# Patient Record
Sex: Female | Born: 1965 | Race: White | Hispanic: No | Marital: Married | Smoking: Former smoker
Health system: Southern US, Community
[De-identification: ages and names within clinical notes are randomized; demographics above are authoritative.]

## PROBLEM LIST (undated history)

## (undated) DIAGNOSIS — K219 Gastro-esophageal reflux disease without esophagitis: Secondary | ICD-10-CM

## (undated) DIAGNOSIS — I10 Essential (primary) hypertension: Secondary | ICD-10-CM

## (undated) DIAGNOSIS — C801 Malignant (primary) neoplasm, unspecified: Secondary | ICD-10-CM

## (undated) HISTORY — PX: CHOLECYSTECTOMY: SHX55

## (undated) HISTORY — DX: Malignant (primary) neoplasm, unspecified: C80.1

## (undated) HISTORY — DX: Gastro-esophageal reflux disease without esophagitis: K21.9

## (undated) HISTORY — DX: Essential (primary) hypertension: I10

---

## 2004-05-03 ENCOUNTER — Emergency Department: Payer: Self-pay | Admitting: General Practice

## 2004-07-25 ENCOUNTER — Emergency Department: Payer: Self-pay | Admitting: Emergency Medicine

## 2004-08-14 ENCOUNTER — Emergency Department: Payer: Self-pay | Admitting: Emergency Medicine

## 2004-08-17 ENCOUNTER — Emergency Department: Payer: Self-pay | Admitting: Unknown Physician Specialty

## 2005-02-11 ENCOUNTER — Emergency Department: Payer: Self-pay | Admitting: Emergency Medicine

## 2005-02-20 ENCOUNTER — Ambulatory Visit: Payer: Self-pay

## 2006-01-21 ENCOUNTER — Emergency Department: Payer: Self-pay | Admitting: Unknown Physician Specialty

## 2007-01-07 ENCOUNTER — Emergency Department: Payer: Self-pay

## 2007-06-18 ENCOUNTER — Emergency Department: Payer: Self-pay | Admitting: Emergency Medicine

## 2007-07-11 ENCOUNTER — Emergency Department: Payer: Self-pay | Admitting: Emergency Medicine

## 2008-01-30 ENCOUNTER — Emergency Department: Payer: Self-pay | Admitting: Emergency Medicine

## 2008-04-29 ENCOUNTER — Emergency Department: Payer: Self-pay | Admitting: Unknown Physician Specialty

## 2008-06-12 ENCOUNTER — Emergency Department: Payer: Self-pay | Admitting: Emergency Medicine

## 2008-10-08 ENCOUNTER — Emergency Department: Payer: Self-pay | Admitting: Emergency Medicine

## 2009-02-28 ENCOUNTER — Emergency Department: Payer: Self-pay | Admitting: Emergency Medicine

## 2009-12-05 ENCOUNTER — Emergency Department: Payer: Self-pay | Admitting: Emergency Medicine

## 2012-01-01 ENCOUNTER — Ambulatory Visit: Payer: Self-pay | Admitting: Family Medicine

## 2012-03-03 ENCOUNTER — Emergency Department: Payer: Self-pay | Admitting: Emergency Medicine

## 2013-11-23 ENCOUNTER — Emergency Department: Payer: Self-pay | Admitting: Emergency Medicine

## 2014-01-07 ENCOUNTER — Emergency Department: Payer: Self-pay | Admitting: Emergency Medicine

## 2014-03-27 ENCOUNTER — Emergency Department: Payer: Self-pay | Admitting: Student

## 2014-04-22 ENCOUNTER — Emergency Department: Payer: Self-pay | Admitting: Emergency Medicine

## 2014-04-22 LAB — COMPREHENSIVE METABOLIC PANEL
ALK PHOS: 276 U/L — AB
AST: 29 U/L (ref 15–37)
Albumin: 3.3 g/dL — ABNORMAL LOW (ref 3.4–5.0)
Anion Gap: 9 (ref 7–16)
BUN: 16 mg/dL (ref 7–18)
Bilirubin,Total: 0.5 mg/dL (ref 0.2–1.0)
CHLORIDE: 102 mmol/L (ref 98–107)
CO2: 22 mmol/L (ref 21–32)
CREATININE: 1.01 mg/dL (ref 0.60–1.30)
Calcium, Total: 9.8 mg/dL (ref 8.5–10.1)
EGFR (African American): >60
EGFR (Non-African Amer.): >60
Glucose: 104 mg/dL — ABNORMAL HIGH (ref 65–99)
Osmolality: 268 (ref 275–301)
POTASSIUM: 3.6 mmol/L (ref 3.5–5.1)
SGPT (ALT): 17 U/L
Sodium: 133 mmol/L — ABNORMAL LOW (ref 136–145)
TOTAL PROTEIN: 8.1 g/dL (ref 6.4–8.2)

## 2014-04-22 LAB — CBC WITH DIFFERENTIAL/PLATELET
Basophil #: 0.2 10*3/uL — ABNORMAL HIGH (ref 0.0–0.1)
Basophil %: 1 %
EOS ABS: 0.2 10*3/uL (ref 0.0–0.7)
Eosinophil %: 1.2 %
HCT: 36.1 % (ref 35.0–47.0)
HGB: 11.4 g/dL — ABNORMAL LOW (ref 12.0–16.0)
Lymphocyte #: 3.4 10*3/uL (ref 1.0–3.6)
Lymphocyte %: 20.4 %
MCH: 27.4 pg (ref 26.0–34.0)
MCHC: 31.5 g/dL — ABNORMAL LOW (ref 32.0–36.0)
MCV: 87 fL (ref 80–100)
Monocyte #: 1.4 x10 3/mm — ABNORMAL HIGH (ref 0.2–0.9)
Monocyte %: 8.2 %
Neutrophil #: 11.7 10*3/uL — ABNORMAL HIGH (ref 1.4–6.5)
Neutrophil %: 69.2 %
Platelet: 352 10*3/uL (ref 150–440)
RBC: 4.15 10*6/uL (ref 3.80–5.20)
RDW: 17 % — AB (ref 11.5–14.5)
WBC: 16.9 10*3/uL — ABNORMAL HIGH (ref 3.6–11.0)

## 2014-04-22 LAB — URINALYSIS, COMPLETE
BACTERIA: NONE SEEN
BLOOD: NEGATIVE
Bilirubin,UR: NEGATIVE
GLUCOSE, UR: NEGATIVE mg/dL (ref 0–75)
Ketone: NEGATIVE
Leukocyte Esterase: NEGATIVE
NITRITE: NEGATIVE
Ph: 5 (ref 4.5–8.0)
Protein: NEGATIVE
RBC,UR: <1 /HPF (ref 0–5)
Specific Gravity: 1.012 (ref 1.003–1.030)
WBC UR: 1 /HPF (ref 0–5)

## 2014-04-22 LAB — ACETAMINOPHEN LEVEL: Acetaminophen: <2 ug/mL

## 2014-04-22 LAB — LIPASE, BLOOD: Lipase: 68 U/L — ABNORMAL LOW (ref 73–393)

## 2014-05-01 ENCOUNTER — Ambulatory Visit: Payer: Self-pay | Admitting: Oncology

## 2014-05-03 ENCOUNTER — Ambulatory Visit: Payer: Self-pay | Admitting: Oncology

## 2014-05-03 DIAGNOSIS — C801 Malignant (primary) neoplasm, unspecified: Secondary | ICD-10-CM

## 2014-05-03 HISTORY — DX: Malignant (primary) neoplasm, unspecified: C80.1

## 2014-05-03 LAB — COMPREHENSIVE METABOLIC PANEL
ALK PHOS: 223 U/L — AB
ALT: 19 U/L
AST: 18 U/L (ref 15–37)
Albumin: 3.2 g/dL — ABNORMAL LOW (ref 3.4–5.0)
Anion Gap: 11 (ref 7–16)
BILIRUBIN TOTAL: 0.4 mg/dL (ref 0.2–1.0)
BUN: 15 mg/dL (ref 7–18)
CREATININE: 0.79 mg/dL (ref 0.60–1.30)
Calcium, Total: 10.1 mg/dL (ref 8.5–10.1)
Chloride: 99 mmol/L (ref 98–107)
Co2: 28 mmol/L (ref 21–32)
EGFR (Non-African Amer.): >60
Glucose: 102 mg/dL — ABNORMAL HIGH (ref 65–99)
Osmolality: 277 (ref 275–301)
POTASSIUM: 3.5 mmol/L (ref 3.5–5.1)
Sodium: 138 mmol/L (ref 136–145)
Total Protein: 7.7 g/dL (ref 6.4–8.2)

## 2014-05-03 LAB — CBC CANCER CENTER
Basophil #: 0.1 x10 3/mm (ref 0.0–0.1)
Basophil %: 0.6 %
EOS ABS: 0.2 x10 3/mm (ref 0.0–0.7)
Eosinophil %: 1.8 %
HCT: 37.4 % (ref 35.0–47.0)
HGB: 11.9 g/dL — AB (ref 12.0–16.0)
Lymphocyte #: 2.2 x10 3/mm (ref 1.0–3.6)
Lymphocyte %: 17.8 %
MCH: 27.3 pg (ref 26.0–34.0)
MCHC: 31.8 g/dL — ABNORMAL LOW (ref 32.0–36.0)
MCV: 86 fL (ref 80–100)
Monocyte #: 0.9 x10 3/mm (ref 0.2–0.9)
Monocyte %: 7.2 %
Neutrophil #: 9 x10 3/mm — ABNORMAL HIGH (ref 1.4–6.5)
Neutrophil %: 72.6 %
Platelet: 451 x10 3/mm — ABNORMAL HIGH (ref 150–440)
RBC: 4.37 10*6/uL (ref 3.80–5.20)
RDW: 16.6 % — ABNORMAL HIGH (ref 11.5–14.5)
WBC: 12.4 x10 3/mm — ABNORMAL HIGH (ref 3.6–11.0)

## 2014-05-03 LAB — PROTIME-INR
INR: 1.1
Prothrombin Time: 14.4 secs (ref 11.5–14.7)

## 2014-05-03 LAB — APTT: Activated PTT: 29.3 secs (ref 23.6–35.9)

## 2014-05-04 LAB — CEA: CEA: 82.6 ng/mL — AB (ref 0.0–4.7)

## 2014-05-04 LAB — PROT IMMUNOELECTROPHORES(ARMC)

## 2014-05-04 LAB — CA 125: CA 125: 51.3 U/mL — AB (ref 0.0–34.0)

## 2014-05-04 LAB — CANCER ANTIGEN 19-9: CA 19-9: 10 U/mL (ref 0–35)

## 2014-05-04 LAB — CANCER ANTIGEN 27.29: CA 27.29: 73.5 U/mL — ABNORMAL HIGH (ref 0.0–38.6)

## 2014-05-06 LAB — URINE IEP, RANDOM

## 2014-05-12 ENCOUNTER — Ambulatory Visit: Payer: Self-pay | Admitting: Internal Medicine

## 2014-05-17 ENCOUNTER — Ambulatory Visit: Payer: Self-pay | Admitting: Internal Medicine

## 2014-05-24 ENCOUNTER — Inpatient Hospital Stay: Payer: Self-pay | Admitting: Internal Medicine

## 2014-05-24 LAB — CBC
HCT: 38.3 % (ref 35.0–47.0)
HGB: 12.4 g/dL (ref 12.0–16.0)
MCH: 27 pg (ref 26.0–34.0)
MCHC: 32.4 g/dL (ref 32.0–36.0)
MCV: 83 fL (ref 80–100)
Platelet: 347 10*3/uL (ref 150–440)
RBC: 4.61 10*6/uL (ref 3.80–5.20)
RDW: 16.8 % — ABNORMAL HIGH (ref 11.5–14.5)
WBC: 14.7 10*3/uL — ABNORMAL HIGH (ref 3.6–11.0)

## 2014-05-24 LAB — COMPREHENSIVE METABOLIC PANEL
AST: 20 U/L (ref 15–37)
Albumin: 2.4 g/dL — ABNORMAL LOW (ref 3.4–5.0)
Alkaline Phosphatase: 313 U/L — ABNORMAL HIGH
Anion Gap: 9 (ref 7–16)
BILIRUBIN TOTAL: 0.5 mg/dL (ref 0.2–1.0)
BUN: 10 mg/dL (ref 7–18)
CHLORIDE: 102 mmol/L (ref 98–107)
CREATININE: 0.96 mg/dL (ref 0.60–1.30)
Calcium, Total: 9.1 mg/dL (ref 8.5–10.1)
Co2: 27 mmol/L (ref 21–32)
EGFR (African American): >60
EGFR (Non-African Amer.): >60
Glucose: 90 mg/dL (ref 65–99)
OSMOLALITY: 274 (ref 275–301)
Potassium: 3.2 mmol/L — ABNORMAL LOW (ref 3.5–5.1)
SGPT (ALT): 12 U/L — ABNORMAL LOW
SODIUM: 138 mmol/L (ref 136–145)
Total Protein: 7.1 g/dL (ref 6.4–8.2)

## 2014-05-25 LAB — BASIC METABOLIC PANEL
Anion Gap: 8 (ref 7–16)
BUN: 10 mg/dL (ref 7–18)
CALCIUM: 8.8 mg/dL (ref 8.5–10.1)
CREATININE: 0.71 mg/dL (ref 0.60–1.30)
Chloride: 107 mmol/L (ref 98–107)
Co2: 26 mmol/L (ref 21–32)
EGFR (African American): >60
EGFR (Non-African Amer.): >60
Glucose: 84 mg/dL (ref 65–99)
OSMOLALITY: 279 (ref 275–301)
POTASSIUM: 3.8 mmol/L (ref 3.5–5.1)
Sodium: 141 mmol/L (ref 136–145)

## 2014-05-25 LAB — CBC WITH DIFFERENTIAL/PLATELET
BASOS PCT: 0.6 %
Basophil #: 0.1 10*3/uL (ref 0.0–0.1)
EOS ABS: 0.2 10*3/uL (ref 0.0–0.7)
EOS PCT: 2.2 %
HCT: 36.4 % (ref 35.0–47.0)
HGB: 11.6 g/dL — AB (ref 12.0–16.0)
LYMPHS PCT: 12 %
Lymphocyte #: 1.4 10*3/uL (ref 1.0–3.6)
MCH: 27.1 pg (ref 26.0–34.0)
MCHC: 31.8 g/dL — AB (ref 32.0–36.0)
MCV: 85 fL (ref 80–100)
Monocyte #: 0.9 x10 3/mm (ref 0.2–0.9)
Monocyte %: 7.6 %
NEUTROS PCT: 77.6 %
Neutrophil #: 8.8 10*3/uL — ABNORMAL HIGH (ref 1.4–6.5)
Platelet: 314 10*3/uL (ref 150–440)
RBC: 4.27 10*6/uL (ref 3.80–5.20)
RDW: 16.5 % — ABNORMAL HIGH (ref 11.5–14.5)
WBC: 11.3 10*3/uL — AB (ref 3.6–11.0)

## 2014-05-25 LAB — TSH: Thyroid Stimulating Horm: 0.07 u[IU]/mL — ABNORMAL LOW

## 2014-05-25 LAB — T4, FREE: Free Thyroxine: 0.93 ng/dL (ref 0.76–1.46)

## 2014-05-31 ENCOUNTER — Ambulatory Visit: Payer: Self-pay | Admitting: Oncology

## 2014-06-02 LAB — CBC CANCER CENTER
Basophil #: 0.1 x10 3/mm (ref 0.0–0.1)
Basophil %: 0.5 %
Eosinophil #: 0 x10 3/mm (ref 0.0–0.7)
Eosinophil %: 0 %
HCT: 40.8 % (ref 35.0–47.0)
HGB: 12.8 g/dL (ref 12.0–16.0)
LYMPHS ABS: 1.3 x10 3/mm (ref 1.0–3.6)
Lymphocyte %: 5.9 %
MCH: 25.9 pg — AB (ref 26.0–34.0)
MCHC: 31.3 g/dL — ABNORMAL LOW (ref 32.0–36.0)
MCV: 83 fL (ref 80–100)
Monocyte #: 1 x10 3/mm — ABNORMAL HIGH (ref 0.2–0.9)
Monocyte %: 4.2 %
NEUTROS ABS: 20.3 x10 3/mm — AB (ref 1.4–6.5)
NEUTROS PCT: 89.4 %
Platelet: 462 x10 3/mm — ABNORMAL HIGH (ref 150–440)
RBC: 4.93 10*6/uL (ref 3.80–5.20)
RDW: 17.5 % — ABNORMAL HIGH (ref 11.5–14.5)
WBC: 22.7 x10 3/mm — ABNORMAL HIGH (ref 3.6–11.0)

## 2014-06-02 LAB — COMPREHENSIVE METABOLIC PANEL
Albumin: 2.5 g/dL — ABNORMAL LOW (ref 3.4–5.0)
Alkaline Phosphatase: 439 U/L — ABNORMAL HIGH
Anion Gap: 13 (ref 7–16)
BUN: 22 mg/dL — AB (ref 7–18)
Bilirubin,Total: 0.4 mg/dL (ref 0.2–1.0)
CHLORIDE: 100 mmol/L (ref 98–107)
Calcium, Total: 8.7 mg/dL (ref 8.5–10.1)
Co2: 23 mmol/L (ref 21–32)
Creatinine: 1.08 mg/dL (ref 0.60–1.30)
EGFR (Non-African Amer.): 57 — ABNORMAL LOW
Glucose: 164 mg/dL — ABNORMAL HIGH (ref 65–99)
Osmolality: 279 (ref 275–301)
POTASSIUM: 4.1 mmol/L (ref 3.5–5.1)
SGOT(AST): 71 U/L — ABNORMAL HIGH (ref 15–37)
SGPT (ALT): 158 U/L — ABNORMAL HIGH
SODIUM: 136 mmol/L (ref 136–145)
Total Protein: 7.4 g/dL (ref 6.4–8.2)

## 2014-06-07 ENCOUNTER — Encounter: Payer: Self-pay | Admitting: *Deleted

## 2014-06-08 ENCOUNTER — Ambulatory Visit (INDEPENDENT_AMBULATORY_CARE_PROVIDER_SITE_OTHER): Payer: Self-pay | Admitting: General Surgery

## 2014-06-08 ENCOUNTER — Encounter: Payer: Self-pay | Admitting: General Surgery

## 2014-06-08 VITALS — BP 120/78 | HR 78 | Resp 12 | Ht 61.0 in | Wt 131.0 lb

## 2014-06-08 DIAGNOSIS — C3432 Malignant neoplasm of lower lobe, left bronchus or lung: Secondary | ICD-10-CM

## 2014-06-08 NOTE — Progress Notes (Signed)
Patient ID: [name], female   DOB: [birth year], 48 y.o.   MRN: [id number]  Chief Complaint  Patient presents with  . Other    port placement    HPI RAILEIGH Moyer is a 48 y.o. female.  Here today for evaluation of port placement. She was diagnosed with lung cancer with bone and brain mets. It was diagnosed 05-03-14. She has already had a few radiation as well as her first chemotherapy treatments. She has radiation scheduled when she leaves here today.  The patient is in a wheelchair today. Lower extremity strength has been markedly affected by her spinal metastases. Her family reports she makes use of a walker at home. She is here with her daughter and mother.   HPI  Past Medical History  Diagnosis Date  . Hypertension   . GERD (gastroesophageal reflux disease)   . Cancer 05-03-14    lung with mets to lower spine and brain    Past Surgical History  Procedure Laterality Date  . Cholecystectomy      Family History  Problem Relation Age of Onset  . Hypertension Mother   . Hypertension Father   . Diabetes Father   . Thyroid disease Mother     Social History History  Substance Use Topics  . Smoking status: Former Smoker -- 30 years    Quit date: 05/23/2014  . Smokeless tobacco: Not on file  . Alcohol Use: No    No Known Allergies  Current Outpatient Prescriptions  Medication Sig Dispense Refill  . albuterol (PROVENTIL HFA;VENTOLIN HFA) 108 (90 BASE) MCG/ACT inhaler Inhale 2 puffs into the lungs every 6 (six) hours as needed for wheezing or shortness of breath.    Marland Kitchen atenolol (TENORMIN) 100 MG tablet Take 100 mg by mouth daily.    Marland Kitchen dexamethasone (DECADRON) 4 MG tablet Take 4 mg by mouth 2 (two) times daily.   1  . fentaNYL (DURAGESIC - DOSED MCG/HR) 100 MCG/HR Place 100 mcg onto the skin every 3 (three) days.    Marland Kitchen gabapentin (NEURONTIN) 100 MG capsule Take 100 mg by mouth 3 (three) times daily.    Marland Kitchen guaiFENesin (MUCINEX) 600 MG 12 hr tablet Take by mouth 2 (two)  times daily as needed.    Marland Kitchen HYDROcodone-acetaminophen (NORCO/VICODIN) 5-325 MG per tablet Take 1 tablet by mouth every 6 (six) hours as needed for moderate pain.    Marland Kitchen morphine (MSIR) 15 MG tablet Take 15 mg by mouth every 4 (four) hours as needed.   0  . pantoprazole (PROTONIX) 40 MG tablet Take 40 mg by mouth daily.    . prochlorperazine (COMPAZINE) 10 MG tablet Take 10 mg by mouth every 6 (six) hours as needed for nausea or vomiting.    . tiotropium (SPIRIVA HANDIHALER) 18 MCG inhalation capsule Place 18 mcg into inhaler and inhale daily.    Marland Kitchen venlafaxine (EFFEXOR) 37.5 MG tablet Take 37.5 mg by mouth 2 (two) times daily.     No current facility-administered medications for this visit.    Review of Systems Review of Systems  Constitutional: Negative.   Respiratory: Negative.   Cardiovascular: Negative.     Blood pressure 120/78, pulse 78, resp. rate 12, height 5\' 1"  (1.549 m), weight 131 lb (59.421 kg).  Physical Exam Physical Exam  Constitutional: She is oriented to person, place, and time. She appears well-developed and well-nourished.  Neck: Neck supple.  Cardiovascular: Normal rate, regular rhythm and normal heart sounds.   Pulmonary/Chest: Effort normal. She has  rhonchi in the left lower field.  Lymphadenopathy:    She has no cervical adenopathy.  Neurological: She is alert and oriented to person, place, and time.  Skin: Skin is warm and dry.    Data Reviewed Left lower lobe lung biopsy showed non-small cell carcinoma.  Medical oncology notes report stage IV disease with bone and brain metastases.  Assessment    Metastatic lung cancer, need for central venous access.    Plan    We'll plan for left power port placement in the near future. While she is left-handed, considering the left lung is involved with cancer, she'll be a lower risk for pulmonary compromise should she develop a pneumothorax on the left than on the right.  The risks associated with central  venous access including arterial, pulmonary and venous injury were reviewed. The possible need for additional treatment if pulmonary injury occurs (chest tube placement) was discussed.  Patient's surgery has been scheduled for 06-21-14 at [facility].     PCP: Dr Veda Canning Ref: Dr Tonny Bollman, Forest Gleason 06/09/2014, 8:21 AM

## 2014-06-08 NOTE — Patient Instructions (Addendum)
Implanted Port Insertion An implanted port is a central line that has a round shape and is placed under the skin. It is used as a long-term IV access for:   Medicines, such as chemotherapy.   Fluids.   Liquid nutrition, such as total parenteral nutrition (TPN).   Blood samples.  LET YOUR HEALTH CARE PROVIDER KNOW ABOUT:  Allergies to food or medicine.   Medicines taken, including vitamins, herbs, eye drops, creams, and over-the-counter medicines.   Any allergies to heparin.  Use of steroids (by mouth or creams).   Previous problems with anesthetics or numbing medicines.   History of bleeding problems or blood clots.   Previous surgery.   Other health problems, including diabetes and kidney problems.   Possibility of pregnancy, if this applies. RISKS AND COMPLICATIONS Generally, this is a safe procedure. However, as with any procedure, problems can occur. Possible problems include:  Damage to the blood vessel, bruising, or bleeding at the puncture site.   Infection.  Blood clot in the vessel that the port is in.  Breakdown of the skin over your port.  Very rarely a person may develop a condition called a pneumothorax, a collection of air in the chest that may cause one of the lungs to collapse. The placement of these catheters with the appropriate imaging guidance significantly decreases the risk of a pneumothorax.  BEFORE THE PROCEDURE   Your health care provider may want you to have blood tests. These tests can help tell how well your kidneys and liver are working. They can also show how well your blood clots.   If you take blood thinners (anticoagulant medicines), ask your health care provider when you should stop taking them.   Make arrangements for someone to drive you home. This is necessary if you have been sedated for your procedure.  PROCEDURE  Port insertion usually takes about 30-45 minutes.   An IV needle will be inserted in your arm.  Medicine for pain and medicine to help relax you (sedative) will flow directly into your body through this needle.   You will lie on an exam table, and you will be connected to monitors to keep track of your heart rate, blood pressure, and breathing throughout the procedure.  An oxygen monitoring device may be attached to your finger. Oxygen will be given.   Everything will be kept as germ free (sterile) as possible during the procedure. The skin near the point of the incision will be cleansed with antiseptic, and the area will be draped with sterile towels. The skin and deeper tissues over the port area will be made numb with a local anesthetic.  Two small cuts (incisions) will be made in the skin to insert the port. One will be made in the neck to obtain access to the vein where the catheter will lie.   Because the port reservoir will be placed under the skin, a small skin incision will be made in the upper chest, and a small pocket for the port will be made under the skin. The catheter that will be connected to the port tunnels to a large central vein in the chest. A small, raised area will remain on your body at the site of the reservoir when the procedure is complete.  The port placement will be done under imaging guidance to ensure the proper placement.  The reservoir has a silicone covering that can be punctured with a special needle.   The port will be flushed with normal   saline, and blood will be drawn to make sure it is working properly.  There will be nothing remaining outside the skin when the procedure is finished.   Incisions will be held together by stitches, surgical glue, or a special tape. AFTER THE PROCEDURE  You will stay in a recovery area until the anesthesia has worn off. Your blood pressure and pulse will be checked.  A final chest X-ray will be taken to check the placement of the port and to ensure that there is no injury to your lung. Document Released:  04/07/2013 Document Revised: 11/01/2013 Document Reviewed: 04/07/2013 [facility] Patient Information 2015 [place], [place]. This information is not intended to replace advice given to you by your health care provider. Make sure you discuss any questions you have with your health care provider.  Patient's surgery has been scheduled for 06-21-14 at [facility].

## 2014-06-09 ENCOUNTER — Other Ambulatory Visit: Payer: Self-pay | Admitting: General Surgery

## 2014-06-09 DIAGNOSIS — C3432 Malignant neoplasm of lower lobe, left bronchus or lung: Secondary | ICD-10-CM | POA: Insufficient documentation

## 2014-06-09 LAB — CBC CANCER CENTER
Basophil #: 0.1 x10 3/mm (ref 0.0–0.1)
Basophil %: 0.6 %
Eosinophil #: 0 x10 3/mm (ref 0.0–0.7)
Eosinophil %: 0.4 %
HCT: 43 % (ref 35.0–47.0)
HGB: 13.8 g/dL (ref 12.0–16.0)
LYMPHS ABS: 0.6 x10 3/mm — AB (ref 1.0–3.6)
Lymphocyte %: 6.4 %
MCH: 26.3 pg (ref 26.0–34.0)
MCHC: 32 g/dL (ref 32.0–36.0)
MCV: 82 fL (ref 80–100)
MONOS PCT: 4 %
Monocyte #: 0.4 x10 3/mm (ref 0.2–0.9)
Neutrophil #: 8.9 x10 3/mm — ABNORMAL HIGH (ref 1.4–6.5)
Neutrophil %: 88.6 %
Platelet: 238 x10 3/mm (ref 150–440)
RBC: 5.24 10*6/uL — AB (ref 3.80–5.20)
RDW: 17.2 % — AB (ref 11.5–14.5)
WBC: 10 x10 3/mm (ref 3.6–11.0)

## 2014-06-09 LAB — COMPREHENSIVE METABOLIC PANEL
ALT: 115 U/L — AB
Albumin: 2.8 g/dL — ABNORMAL LOW (ref 3.4–5.0)
Alkaline Phosphatase: 438 U/L — ABNORMAL HIGH
Anion Gap: 11 (ref 7–16)
BUN: 18 mg/dL (ref 7–18)
Bilirubin,Total: 0.4 mg/dL (ref 0.2–1.0)
CO2: 24 mmol/L (ref 21–32)
Calcium, Total: 8.2 mg/dL — ABNORMAL LOW (ref 8.5–10.1)
Chloride: 100 mmol/L (ref 98–107)
Creatinine: 0.78 mg/dL (ref 0.60–1.30)
EGFR (African American): >60
EGFR (Non-African Amer.): >60
Glucose: 174 mg/dL — ABNORMAL HIGH (ref 65–99)
Osmolality: 276 (ref 275–301)
Potassium: 4.4 mmol/L (ref 3.5–5.1)
SGOT(AST): 25 U/L (ref 15–37)
SODIUM: 135 mmol/L — AB (ref 136–145)
Total Protein: 6.8 g/dL (ref 6.4–8.2)

## 2014-06-15 ENCOUNTER — Ambulatory Visit: Payer: Self-pay | Admitting: General Surgery

## 2014-06-20 LAB — COMPREHENSIVE METABOLIC PANEL
ALK PHOS: 486 U/L — AB
Albumin: 2.6 g/dL — ABNORMAL LOW (ref 3.4–5.0)
Anion Gap: 9 (ref 7–16)
BUN: 17 mg/dL (ref 7–18)
Bilirubin,Total: 0.3 mg/dL (ref 0.2–1.0)
CALCIUM: 8.1 mg/dL — AB (ref 8.5–10.1)
CREATININE: 0.54 mg/dL — AB (ref 0.60–1.30)
Chloride: 103 mmol/L (ref 98–107)
Co2: 29 mmol/L (ref 21–32)
EGFR (African American): >60
EGFR (Non-African Amer.): >60
GLUCOSE: 102 mg/dL — AB (ref 65–99)
OSMOLALITY: 283 (ref 275–301)
Potassium: 4.2 mmol/L (ref 3.5–5.1)
SGOT(AST): 20 U/L (ref 15–37)
SGPT (ALT): 65 U/L — ABNORMAL HIGH
Sodium: 141 mmol/L (ref 136–145)
Total Protein: 6.2 g/dL — ABNORMAL LOW (ref 6.4–8.2)

## 2014-06-20 LAB — CBC CANCER CENTER
Basophil #: 0 x10 3/mm (ref 0.0–0.1)
Basophil %: 0.3 %
Eosinophil #: 0 x10 3/mm (ref 0.0–0.7)
Eosinophil %: 0.1 %
HCT: 40.8 % (ref 35.0–47.0)
HGB: 13 g/dL (ref 12.0–16.0)
Lymphocyte #: 0.7 x10 3/mm — ABNORMAL LOW (ref 1.0–3.6)
Lymphocyte %: 4.8 %
MCH: 26.6 pg (ref 26.0–34.0)
MCHC: 31.9 g/dL — ABNORMAL LOW (ref 32.0–36.0)
MCV: 83 fL (ref 80–100)
Monocyte #: 1.1 x10 3/mm — ABNORMAL HIGH (ref 0.2–0.9)
Monocyte %: 7.5 %
Neutrophil #: 13.2 x10 3/mm — ABNORMAL HIGH (ref 1.4–6.5)
Neutrophil %: 87.3 %
Platelet: 202 x10 3/mm (ref 150–440)
RBC: 4.9 10*6/uL (ref 3.80–5.20)
RDW: 18.8 % — ABNORMAL HIGH (ref 11.5–14.5)
WBC: 15.1 x10 3/mm — ABNORMAL HIGH (ref 3.6–11.0)

## 2014-06-21 ENCOUNTER — Ambulatory Visit: Payer: Self-pay | Admitting: General Surgery

## 2014-06-21 DIAGNOSIS — C3490 Malignant neoplasm of unspecified part of unspecified bronchus or lung: Secondary | ICD-10-CM

## 2014-06-27 ENCOUNTER — Encounter: Payer: Self-pay | Admitting: General Surgery

## 2014-07-01 ENCOUNTER — Ambulatory Visit: Payer: Self-pay | Admitting: Oncology

## 2014-07-07 LAB — CBC CANCER CENTER
BASOS PCT: 0.2 %
Basophil #: 0 x10 3/mm (ref 0.0–0.1)
EOS ABS: 0 x10 3/mm (ref 0.0–0.7)
Eosinophil %: 0 %
HCT: 40.7 % (ref 35.0–47.0)
HGB: 13.2 g/dL (ref 12.0–16.0)
LYMPHS PCT: 14.5 %
Lymphocyte #: 1.1 x10 3/mm (ref 1.0–3.6)
MCH: 26.7 pg (ref 26.0–34.0)
MCHC: 32.4 g/dL (ref 32.0–36.0)
MCV: 83 fL (ref 80–100)
Monocyte #: 0.4 x10 3/mm (ref 0.2–0.9)
Monocyte %: 5.1 %
NEUTROS ABS: 5.9 x10 3/mm (ref 1.4–6.5)
Neutrophil %: 80.2 %
PLATELETS: 189 x10 3/mm (ref 150–440)
RBC: 4.92 10*6/uL (ref 3.80–5.20)
RDW: 19.9 % — ABNORMAL HIGH (ref 11.5–14.5)
WBC: 7.4 x10 3/mm (ref 3.6–11.0)

## 2014-07-14 LAB — COMPREHENSIVE METABOLIC PANEL
ALBUMIN: 2.8 g/dL — AB (ref 3.4–5.0)
ANION GAP: 8 (ref 7–16)
AST: 18 U/L (ref 15–37)
Alkaline Phosphatase: 210 U/L — ABNORMAL HIGH
BUN: 15 mg/dL (ref 7–18)
Bilirubin,Total: 0.5 mg/dL (ref 0.2–1.0)
CALCIUM: 8.4 mg/dL — AB (ref 8.5–10.1)
Chloride: 102 mmol/L (ref 98–107)
Co2: 29 mmol/L (ref 21–32)
Creatinine: 0.64 mg/dL (ref 0.60–1.30)
EGFR (Non-African Amer.): >60
GLUCOSE: 163 mg/dL — AB (ref 65–99)
Osmolality: 282 (ref 275–301)
POTASSIUM: 4.2 mmol/L (ref 3.5–5.1)
SGPT (ALT): 62 U/L
Sodium: 139 mmol/L (ref 136–145)
Total Protein: 6.3 g/dL — ABNORMAL LOW (ref 6.4–8.2)

## 2014-07-14 LAB — CBC CANCER CENTER
Basophil #: 0 x10 3/mm (ref 0.0–0.1)
Basophil %: 0.3 %
EOS ABS: 0 x10 3/mm (ref 0.0–0.7)
Eosinophil %: 0 %
HCT: 42.2 % (ref 35.0–47.0)
HGB: 14 g/dL (ref 12.0–16.0)
LYMPHS PCT: 5.8 %
Lymphocyte #: 0.7 x10 3/mm — ABNORMAL LOW (ref 1.0–3.6)
MCH: 27.2 pg (ref 26.0–34.0)
MCHC: 33.3 g/dL (ref 32.0–36.0)
MCV: 82 fL (ref 80–100)
MONOS PCT: 4.3 %
Monocyte #: 0.5 x10 3/mm (ref 0.2–0.9)
NEUTROS ABS: 11.3 x10 3/mm — AB (ref 1.4–6.5)
Neutrophil %: 89.6 %
PLATELETS: 208 x10 3/mm (ref 150–440)
RBC: 5.17 10*6/uL (ref 3.80–5.20)
RDW: 20.5 % — ABNORMAL HIGH (ref 11.5–14.5)
WBC: 12.6 x10 3/mm — AB (ref 3.6–11.0)

## 2014-07-17 ENCOUNTER — Emergency Department: Payer: Self-pay | Admitting: Emergency Medicine

## 2014-07-17 LAB — COMPREHENSIVE METABOLIC PANEL
AST: 22 U/L (ref 15–37)
Albumin: 2.8 g/dL — ABNORMAL LOW (ref 3.4–5.0)
Alkaline Phosphatase: 228 U/L — ABNORMAL HIGH
Anion Gap: 10 (ref 7–16)
BILIRUBIN TOTAL: 0.8 mg/dL (ref 0.2–1.0)
BUN: 23 mg/dL — ABNORMAL HIGH (ref 7–18)
CHLORIDE: 104 mmol/L (ref 98–107)
Calcium, Total: 8.2 mg/dL — ABNORMAL LOW (ref 8.5–10.1)
Co2: 26 mmol/L (ref 21–32)
Creatinine: 0.6 mg/dL (ref 0.60–1.30)
EGFR (African American): >60
EGFR (Non-African Amer.): >60
Glucose: 145 mg/dL — ABNORMAL HIGH (ref 65–99)
Osmolality: 286 (ref 275–301)
POTASSIUM: 4 mmol/L (ref 3.5–5.1)
SGPT (ALT): 66 U/L — ABNORMAL HIGH
Sodium: 140 mmol/L (ref 136–145)
TOTAL PROTEIN: 5.9 g/dL — AB (ref 6.4–8.2)

## 2014-07-17 LAB — URINALYSIS, COMPLETE
BACTERIA: NONE SEEN
BLOOD: NEGATIVE
GLUCOSE, UR: NEGATIVE mg/dL (ref 0–75)
Leukocyte Esterase: NEGATIVE
NITRITE: NEGATIVE
PH: 5 (ref 4.5–8.0)
Protein: 30
RBC,UR: 3 /HPF (ref 0–5)
SPECIFIC GRAVITY: 1.036 (ref 1.003–1.030)
Squamous Epithelial: NONE SEEN
WBC UR: 3 /HPF (ref 0–5)

## 2014-07-17 LAB — TROPONIN I: Troponin-I: <0.02 ng/mL

## 2014-07-17 LAB — DIFFERENTIAL
Basophil #: 0 10*3/uL (ref 0.0–0.1)
Basophil %: 0.2 %
EOS ABS: 0 10*3/uL (ref 0.0–0.7)
EOS PCT: 0.1 %
LYMPHS ABS: 1 10*3/uL (ref 1.0–3.6)
Lymphocyte %: 6.3 %
Monocyte #: 0.9 x10 3/mm (ref 0.2–0.9)
Monocyte %: 5.8 %
Neutrophil #: 13.6 10*3/uL — ABNORMAL HIGH (ref 1.4–6.5)
Neutrophil %: 87.6 %

## 2014-07-17 LAB — CBC
HCT: 48.5 % — AB (ref 35.0–47.0)
HGB: 15.7 g/dL (ref 12.0–16.0)
MCH: 27.3 pg (ref 26.0–34.0)
MCHC: 32.4 g/dL (ref 32.0–36.0)
MCV: 84 fL (ref 80–100)
Platelet: 175 10*3/uL (ref 150–440)
RBC: 5.74 10*6/uL — ABNORMAL HIGH (ref 3.80–5.20)
RDW: 21.5 % — ABNORMAL HIGH (ref 11.5–14.5)
WBC: 15.2 10*3/uL — ABNORMAL HIGH (ref 3.6–11.0)

## 2014-07-17 LAB — PRO B NATRIURETIC PEPTIDE: B-TYPE NATIURETIC PEPTID: 1265 pg/mL — AB (ref 0–125)

## 2014-07-17 LAB — CK TOTAL AND CKMB (NOT AT ARMC)
CK, TOTAL: 25 U/L — AB (ref 26–192)
CK-MB: 1.2 ng/mL (ref 0.5–3.6)

## 2014-07-21 LAB — COMPREHENSIVE METABOLIC PANEL
ALT: 77 U/L — AB
Albumin: 2.6 g/dL — ABNORMAL LOW (ref 3.4–5.0)
Alkaline Phosphatase: 216 U/L — ABNORMAL HIGH
Anion Gap: 12 (ref 7–16)
BUN: 15 mg/dL (ref 7–18)
Bilirubin,Total: 0.7 mg/dL (ref 0.2–1.0)
CREATININE: 0.43 mg/dL — AB (ref 0.60–1.30)
Calcium, Total: 8.3 mg/dL — ABNORMAL LOW (ref 8.5–10.1)
Chloride: 105 mmol/L (ref 98–107)
Co2: 24 mmol/L (ref 21–32)
EGFR (African American): >60
EGFR (Non-African Amer.): >60
GLUCOSE: 164 mg/dL — AB (ref 65–99)
Osmolality: 286 (ref 275–301)
Potassium: 3.6 mmol/L (ref 3.5–5.1)
SGOT(AST): 37 U/L (ref 15–37)
Sodium: 141 mmol/L (ref 136–145)
TOTAL PROTEIN: 6.2 g/dL — AB (ref 6.4–8.2)

## 2014-07-21 LAB — CBC CANCER CENTER
BASOS PCT: 0.3 %
Basophil #: 0 x10 3/mm (ref 0.0–0.1)
EOS ABS: 0 x10 3/mm (ref 0.0–0.7)
Eosinophil %: 0.1 %
HCT: 44.5 % (ref 35.0–47.0)
HGB: 14.7 g/dL (ref 12.0–16.0)
Lymphocyte #: 0.7 x10 3/mm — ABNORMAL LOW (ref 1.0–3.6)
Lymphocyte %: 6.7 %
MCH: 27.3 pg (ref 26.0–34.0)
MCHC: 33.1 g/dL (ref 32.0–36.0)
MCV: 83 fL (ref 80–100)
Monocyte #: 0.5 x10 3/mm (ref 0.2–0.9)
Monocyte %: 4.2 %
NEUTROS ABS: 9.8 x10 3/mm — AB (ref 1.4–6.5)
NEUTROS PCT: 88.7 %
Platelet: 183 x10 3/mm (ref 150–440)
RBC: 5.4 10*6/uL — ABNORMAL HIGH (ref 3.80–5.20)
RDW: 21.3 % — ABNORMAL HIGH (ref 11.5–14.5)
WBC: 11 x10 3/mm (ref 3.6–11.0)

## 2014-07-23 ENCOUNTER — Inpatient Hospital Stay: Payer: Self-pay | Admitting: Internal Medicine

## 2014-07-23 LAB — COMPREHENSIVE METABOLIC PANEL
ALBUMIN: 2.4 g/dL — AB (ref 3.4–5.0)
ALK PHOS: 197 U/L — AB
ALT: 53 U/L
AST: 39 U/L — AB (ref 15–37)
Anion Gap: 10 (ref 7–16)
BUN: 10 mg/dL (ref 7–18)
Bilirubin,Total: 0.6 mg/dL (ref 0.2–1.0)
Calcium, Total: 7.2 mg/dL — ABNORMAL LOW (ref 8.5–10.1)
Chloride: 103 mmol/L (ref 98–107)
Co2: 26 mmol/L (ref 21–32)
Creatinine: 0.35 mg/dL — ABNORMAL LOW (ref 0.60–1.30)
EGFR (African American): >60
EGFR (Non-African Amer.): >60
Glucose: 130 mg/dL — ABNORMAL HIGH (ref 65–99)
Osmolality: 278 (ref 275–301)
Potassium: 3.6 mmol/L (ref 3.5–5.1)
Sodium: 139 mmol/L (ref 136–145)
TOTAL PROTEIN: 5.6 g/dL — AB (ref 6.4–8.2)

## 2014-07-23 LAB — CBC WITH DIFFERENTIAL/PLATELET
Basophil #: 0 10*3/uL (ref 0.0–0.1)
Basophil %: 0.4 %
EOS PCT: 0.1 %
Eosinophil #: 0 10*3/uL (ref 0.0–0.7)
HCT: 44.5 % (ref 35.0–47.0)
HGB: 14.6 g/dL (ref 12.0–16.0)
LYMPHS PCT: 4.5 %
Lymphocyte #: 0.3 10*3/uL — ABNORMAL LOW (ref 1.0–3.6)
MCH: 27.5 pg (ref 26.0–34.0)
MCHC: 32.8 g/dL (ref 32.0–36.0)
MCV: 84 fL (ref 80–100)
MONO ABS: 0.1 x10 3/mm — AB (ref 0.2–0.9)
MONOS PCT: 1 %
Neutrophil #: 6.8 10*3/uL — ABNORMAL HIGH (ref 1.4–6.5)
Neutrophil %: 94 %
Platelet: 116 10*3/uL — ABNORMAL LOW (ref 150–440)
RBC: 5.31 10*6/uL — ABNORMAL HIGH (ref 3.80–5.20)
RDW: 20.7 % — AB (ref 11.5–14.5)
WBC: 7.3 10*3/uL (ref 3.6–11.0)

## 2014-07-23 LAB — PRO B NATRIURETIC PEPTIDE: B-TYPE NATIURETIC PEPTID: 1756 pg/mL — AB (ref 0–125)

## 2014-07-23 LAB — URINALYSIS, COMPLETE
BILIRUBIN, UR: NEGATIVE
Blood: NEGATIVE
Glucose,UR: NEGATIVE mg/dL (ref 0–75)
Leukocyte Esterase: NEGATIVE
Nitrite: NEGATIVE
PH: 5 (ref 4.5–8.0)
Protein: 30
RBC,UR: 5 /HPF (ref 0–5)
SQUAMOUS EPITHELIAL: NONE SEEN
Specific Gravity: 1.015 (ref 1.003–1.030)
WBC UR: 3 /HPF (ref 0–5)

## 2014-07-23 LAB — CK TOTAL AND CKMB (NOT AT ARMC)
CK, Total: 77 U/L (ref 26–192)
CK-MB: 2.8 ng/mL (ref 0.5–3.6)

## 2014-07-23 LAB — TROPONIN I
Troponin-I: 0.03 ng/mL
Troponin-I: 0.03 ng/mL

## 2014-07-24 LAB — BASIC METABOLIC PANEL
ANION GAP: 9 (ref 7–16)
BUN: 6 mg/dL — AB (ref 7–18)
CALCIUM: 6.6 mg/dL — AB (ref 8.5–10.1)
Chloride: 104 mmol/L (ref 98–107)
Co2: 26 mmol/L (ref 21–32)
Creatinine: 0.22 mg/dL — ABNORMAL LOW (ref 0.60–1.30)
Glucose: 106 mg/dL — ABNORMAL HIGH (ref 65–99)
OSMOLALITY: 276 (ref 275–301)
POTASSIUM: 3.2 mmol/L — AB (ref 3.5–5.1)
Sodium: 139 mmol/L (ref 136–145)

## 2014-07-24 LAB — CBC WITH DIFFERENTIAL/PLATELET
BASOS ABS: 0 10*3/uL (ref 0.0–0.1)
Basophil %: 0.2 %
EOS PCT: 0.2 %
Eosinophil #: 0 10*3/uL (ref 0.0–0.7)
HCT: 37.2 % (ref 35.0–47.0)
HGB: 12.2 g/dL (ref 12.0–16.0)
LYMPHS ABS: 0.2 10*3/uL — AB (ref 1.0–3.6)
LYMPHS PCT: 3.5 %
MCH: 27.7 pg (ref 26.0–34.0)
MCHC: 32.8 g/dL (ref 32.0–36.0)
MCV: 84 fL (ref 80–100)
Monocyte #: 0 x10 3/mm — ABNORMAL LOW (ref 0.2–0.9)
Monocyte %: 0.9 %
NEUTROS ABS: 4.5 10*3/uL (ref 1.4–6.5)
NEUTROS PCT: 95.2 %
PLATELETS: 89 10*3/uL — AB (ref 150–440)
RBC: 4.41 10*6/uL (ref 3.80–5.20)
RDW: 20.7 % — ABNORMAL HIGH (ref 11.5–14.5)
WBC: 4.7 10*3/uL (ref 3.6–11.0)

## 2014-07-25 LAB — VANCOMYCIN, TROUGH: Vancomycin, Trough: 10 ug/mL (ref 10–20)

## 2014-07-26 LAB — MAGNESIUM: Magnesium: 1.7 mg/dL — ABNORMAL LOW

## 2014-07-26 LAB — CBC WITH DIFFERENTIAL/PLATELET
BASOS ABS: 0 10*3/uL (ref 0.0–0.1)
Basophil %: 0.4 %
Eosinophil #: 0 10*3/uL (ref 0.0–0.7)
Eosinophil %: 0.1 %
HCT: 33.3 % — AB (ref 35.0–47.0)
HGB: 11.1 g/dL — ABNORMAL LOW (ref 12.0–16.0)
LYMPHS PCT: 4.4 %
Lymphocyte #: 0.1 10*3/uL — ABNORMAL LOW (ref 1.0–3.6)
MCH: 27.3 pg (ref 26.0–34.0)
MCHC: 33.5 g/dL (ref 32.0–36.0)
MCV: 82 fL (ref 80–100)
Monocyte #: 0 x10 3/mm — ABNORMAL LOW (ref 0.2–0.9)
Monocyte %: 0.7 %
NEUTROS ABS: 2.7 10*3/uL (ref 1.4–6.5)
Neutrophil %: 94.4 %
Platelet: 86 10*3/uL — ABNORMAL LOW (ref 150–440)
RBC: 4.08 10*6/uL (ref 3.80–5.20)
RDW: 20.3 % — AB (ref 11.5–14.5)
WBC: 2.9 10*3/uL — ABNORMAL LOW (ref 3.6–11.0)

## 2014-07-26 LAB — BASIC METABOLIC PANEL
Anion Gap: 9 (ref 7–16)
BUN: 3 mg/dL — ABNORMAL LOW (ref 7–18)
CALCIUM: 6.6 mg/dL — AB (ref 8.5–10.1)
CHLORIDE: 102 mmol/L (ref 98–107)
Co2: 29 mmol/L (ref 21–32)
Creatinine: 0.27 mg/dL — ABNORMAL LOW (ref 0.60–1.30)
EGFR (African American): >60
Glucose: 161 mg/dL — ABNORMAL HIGH (ref 65–99)
OSMOLALITY: 279 (ref 275–301)
Potassium: 2.7 mmol/L — ABNORMAL LOW (ref 3.5–5.1)
SODIUM: 140 mmol/L (ref 136–145)

## 2014-07-27 LAB — CBC WITH DIFFERENTIAL/PLATELET
BASOS ABS: 0 10*3/uL (ref 0.0–0.1)
Basophil %: 0.2 %
Eosinophil #: 0 10*3/uL (ref 0.0–0.7)
Eosinophil %: 0 %
HCT: 36 % (ref 35.0–47.0)
HGB: 12.1 g/dL (ref 12.0–16.0)
LYMPHS ABS: 0.2 10*3/uL — AB (ref 1.0–3.6)
Lymphocyte %: 3.4 %
MCH: 27.3 pg (ref 26.0–34.0)
MCHC: 33.7 g/dL (ref 32.0–36.0)
MCV: 81 fL (ref 80–100)
MONOS PCT: 0.9 %
Monocyte #: 0 x10 3/mm — ABNORMAL LOW (ref 0.2–0.9)
NEUTROS ABS: 4.4 10*3/uL (ref 1.4–6.5)
Neutrophil %: 95.5 %
Platelet: 83 10*3/uL — ABNORMAL LOW (ref 150–440)
RBC: 4.45 10*6/uL (ref 3.80–5.20)
RDW: 20.8 % — ABNORMAL HIGH (ref 11.5–14.5)
WBC: 4.6 10*3/uL (ref 3.6–11.0)

## 2014-07-27 LAB — MAGNESIUM: MAGNESIUM: 2.2 mg/dL

## 2014-07-27 LAB — VANCOMYCIN, TROUGH: VANCOMYCIN, TROUGH: 16 ug/mL (ref 10–20)

## 2014-07-27 LAB — POTASSIUM: POTASSIUM: 3.4 mmol/L — AB (ref 3.5–5.1)

## 2014-07-28 LAB — CULTURE, BLOOD (SINGLE)

## 2014-08-01 ENCOUNTER — Ambulatory Visit: Payer: Self-pay | Admitting: Oncology

## 2014-08-30 DEATH — deceased

## 2014-10-22 NOTE — Consult Note (Signed)
Patient admitted with significant debilitating pain in her back and found to have multiple pathologic fractures. Pathology from biopsy earlier this week recently finalized and confirmed stage IV adenocarcinoma of the lung. Case has been discussed with Radiation Oncology. A consult for consideration of palliative XRT tomorrow morning has been ordered.  Appreciate palliative care input. I will be in our [facility] clinic tomorrow morning, full consult to follow tomorrow afternoon. consult, call with questions.  Electronic Signatures: Delight Hoh (MD)  (Signed on 24-Nov-15 18:10)  Authored  Last Updated: 24-Nov-15 18:10 by Delight Hoh (MD)

## 2014-10-22 NOTE — Consult Note (Signed)
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 HPI:   Patient's pain has improved and she feels she is ready to go home. MRI results from yesterday revealed extensive metastatic disease in her brain. She continues to be tearful.  Patient offers no further specific complaints.   Review of Systems:  Performance Status (ECOG): 2  Pain ?: Yes  Pain- Qualitative: Moderate  Pain- Plan: Narcotic analgesic ordered  Discussed with patient: Dietary fiber  Fluids  Emotional well-being: Anxiety  Emotional Well-being: Educational material provided  Review of Systems:   As per HPI. Otherwise, 10 point system review was negative.   Allergies:  No Known Allergies:   Smoking History: Smoking History 1 Packs per day and 40 years. Smoking Cessation Information Given to Patient .  PFSH: Additional Past Medical and Surgical History: COPD, GERD, hypertension, tubal ligation, cholecystectomy.    Family history: negative and noncontributory.    Social history:  Tobacco as above, denies alcohol.   Home Medications: Medication Instructions Last Modified Date/Time  dexamethasone 4 mg oral tablet 1 tab(s) orally 3 times a day 26-Nov-15 13:15  ProAir HFA 90 mcg/inh inhalation aerosol 2 puff(s) inhaled 4 times a day, As Needed - for Shortness of Breath 26-Nov-15 10:58  fentaNYL Apply topically to affected area every 3 days 26-Nov-15 10:58  morphine 15 mg oral tablet 1-2 tab(s) orally every 4 hours, As needed, severe pain (7-10/10) 26-Nov-15 10:58  venlafaxine 37.5 mg oral capsule, extended release 2 cap(s) orally once a day 26-Nov-15 10:58  Spiriva 18 mcg inhalation capsule 1 cap(s) inhaled once a day 26-Nov-15 10:58  gabapentin 300 mg oral capsule 1 cap(s) orally 3 times a day 26-Nov-15 10:58  pantoprazole 40 mg oral delayed release tablet 1 tab(s) orally once a day 26-Nov-15 10:58  atenolol 100  mg oral tablet 1 tab(s) orally once a day 26-Nov-15 10:58  acetaminophen-HYDROcodone 325 mg-10 mg oral tablet 1 tab(s) orally every 6 hours 26-Nov-15 10:58   Vital Signs:  :: vital signs stable, patient afebrile.   Physical Exam:  General: well developed, well nourished, and in no acute distress.  Mental Status: normal affect  Eyes: anicteric sclera  Respiratory: clear to auscultation bilaterally  Cardiovascular: regular rate and rhythm, no murmur, rub, or gallop  Gastrointestinal: soft, nondistended, nontender, no organomegaly.  normal active bowel sounds  Musculoskeletal: No edema  Skin: No rash or petechiae noted  Neurological: alert, answering all questions appropriately.  Cranial nerves grossly intact  Psych: tearful.   Medical Imaging Results:   Review Medical Imaging   Brain  With/Without Contrast 25-May-2014 17:44:00: IMPRESSION:  1. Extensive brain metastases, greater than 30 individual  metastases, ranging from punctate to 4 cm diameter. The largest  metastases are in the both temporal lobes, and some show microscopic  hemorrhage.  2. Fairly mild cerebral edema considering the burden of disease. No  significant intracranial mass effect.      Electronically Signed    By: Lars Pinks M.D.    On: 05/25/2014 19:34         Verified By: Gwenyth Bender. 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 Plan:   1. Lung cancer: Pathology results as above confirming stage IV adenocarcinoma of the lung. MRI results as above with extensive metastatic disease in her brain. Patient has an appointment on Monday  to initiate XRT not only to her back, but to her brain as well. She also has appointment scheduled on Thursday, June 02, 2014 to initiate chemotherapy. Patient expressed understanding and was in agreement with this plan.Pain: Secondary to pathologic fractures. Appreciate palliative care as well  as radiation oncology input. Plan start palliative XRT on Monday, November 30.Brain metastases: Patient was given a prescription for dexamethasone to initiate upon discharge. consult, will follow.   Fax to Physician:  Physicians To Recieve Fax: [facility]) - [phone].  Advance Directive:  Advance Directive Theatre stage manager) no   Advance Directive Information Given patient refused   Electronic Signatures: Delight Hoh (MD)  (Signed 26-Nov-15 13:19)  Authored: Note Type, CC/HPI, Review of Systems, ALLERGIES, Smoking Cessation, Patient Family Social History, HOME MEDICATIONS, Vital Signs, Physical Exam, Rad Results Review, Assessment and Plan, Fax to Physician, Advance Directive   Last Updated: 26-Nov-15 13:19 by Delight Hoh (MD)

## 2014-10-22 NOTE — Discharge Summary (Signed)
PATIENT NAME:  [name], MOTZ MR#:  [id number] DATE OF BIRTH:  April 08, 1966  DATE OF ADMISSION:  05/24/2014 DATE OF DISCHARGE:  05/26/2014  DISCHARGE DIAGNOSES: 1.  Left pubic ramus fracture, pathological.  2.  Adenocarcinoma of the lung, stage IV, with metastasis to multiple bones and brain.  3.  Tobacco abuse.  4.  Chronic obstructive pulmonary disease.   DISCHARGE MEDICATIONS: 1.  Venlafaxine 37.5 mg 2 capsules oral once a day. 2.  Spiriva 18 mcg daily.  3.  Gabapentin 300 mg oral 3 times a day.  4.  Protonix 40 mg once a day.  5.  Atenolol 100 mg oral once a day.  6.  Acetaminophen/hydrocodone 325/10 one tablet oral every 6 hours.  7.  Fentanyl 100 mcg every 3 days.  8.  Morphine 15 mg oral 1 to 2 tablets instant release every 4 hours as needed for severe pain.  9.  ProAir HFA 2 puffs inhaled 4 times a day as needed for shortness of breath.  DISCHARGE INSTRUCTIONS: Regular diet, regular consistency. Activity as tolerated without lifting any heavy weights. Follow up with Dr. Lisbeth Renshaw at radiation oncology on 05/30/2014 and Dr. Grayland Ormond and Dr. Ermalinda Memos in 1 to 2 weeks. The patient has been counseled to quit smoking.   IMAGING STUDIES: Include a hip and lumbar x-ray that showed wide spread bony metastasis. Left superior and inferior pubic ramus fractures.   MRI of the brain with and without contrast showed multiple metastases from 1 mm to 4 cm bilaterally.   ADMITTING HISTORY AND PHYSICAL AND HOSPITAL COURSE: Please see detailed H and P dictated by Dr. Posey Pronto. In brief, a 49 year old female patient with history of tobacco abuse and COPD presented to the hospital complaining of back pain, hip pain. She was recently diagnosed with adenocarcinoma of the lung, stage IV. She was found to have pathological fractures of the left pubic inferior and superior ramus, was admitted for pain control. The patient's fentanyl has been increased and started on morphine instant release tablets which her pain is  much improved. She has been set up with home health PT and is being discharged home. The patient was seen by radiation oncology and will see them on 05/30/2014. Also follow up with Dr. Grayland Ormond and Dr. Ermalinda Memos as an outpatient.   Prior to discharge, the patient's lungs sound clear. S1, S2 heard without any murmurs. No edema.   TIME SPENT ON DAY OF DISCHARGE IN DISCHARGE ACTIVITY: 50 minutes. ____________________________ Leia Alf Tolbert Matheson, MD srs:sb D: 05/30/2014 12:50:20 ET T: 05/30/2014 13:42:52 ET JOB#: [id number]  cc: Alveta Heimlich R. Syla Devoss, MD, <Dictator> Kathlene November. Grayland Ormond, MD Efraim Kaufmann, MD Neita Carp MD ELECTRONICALLY SIGNED 05/30/2014 15:17

## 2014-10-22 NOTE — Consult Note (Signed)
Reason for Visit: This 49 year old Female patient presents to the clinic for initial evaluation of  the patient new diagnosis of stage IV lung cancer.   Referred by Delight Hoh, MD.  Diagnosis:  Chief Complaint/Diagnosis   blow back and leg pain  HPI   The patient is seen today as an inpatinet. She has had low back pain since April and this has been associated with radiation of pain, right greater than left. The patient was diagnosed with sciatica. Further workup recently for ongoing pain demonstrates widely metastatic cancer. 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 patient today complains of worsening low back pain and some pain in the right pelvis, with pain again extending to the legs, right > left. The patient's imaging, including CT scan, MRI of the L spine, and recent xrays have been reviewed and they deomstrate extensive osseous disease consistent with the patient's symptoms. In particular, multifocal disease is seen in the L spine with extensive destruction of the sacrum, and an expanding soft tissue lesion is seen in the right ischium.   Allergies:   No Known Allergies:   LAB Results:  Laboratory Results: Thyroid:  25-Nov-15 04:23   Thyroid Stimulating Hormone  0.070 (0.45-4.50 (IU = International Unit)  ----------------------- Pregnant patients have  different reference  ranges for TSH:  - - - - - - - - - -  Pregnant, first trimetser:  0.36 - 2.50 uIU/mL)    08:08   Thyroxine, Free 0.93 (Result(s) reported on 25 May 2014 at 08:40AM.)  LabObservation:  03-Jul-13 12:29   OBSERVATION Reason for Test [facility] MAMMO NO ORDER  06-Nov-15 09:24   OBSERVATION PACS Image dcm.pi=[id number]&dcm.sa=[id number]  Hepatic:  23-Oct-15 16:23   Bilirubin, Total 0.5  Alkaline Phosphatase  276 (46-116 NOTE: New Reference Range 01/18/14)  SGPT (ALT) 17 (14-63 NOTE: New Reference Range 01/18/14)  SGOT (AST) 29  Total Protein, Serum 8.1  Albumin, Serum  3.3   03-Nov-15 15:23   Bilirubin, Total 0.4  Alkaline Phosphatase  223 (46-116 NOTE: New Reference Range 01/18/14)  SGPT (ALT) 19 (14-63 NOTE: New Reference Range 01/18/14)  SGOT (AST) 18  Total Protein, Serum 7.7  Albumin, Serum  3.2  24-Nov-15 13:07   Bilirubin, Total 0.5  Alkaline Phosphatase  313 (46-116 NOTE: New Reference Range 01/18/14)  SGPT (ALT)  12 (14-63 NOTE: New Reference Range 01/18/14)  SGOT (AST) 20  Total Protein, Serum 7.1  Albumin, Serum  2.4  Pathology:  17-Nov-15 00:00   Pathology Report CASE: [id number] PATIENT: [name] Surgical Pathology Report      SPECIMEN SUBMITTED: A. Subcarina B. Left lower lobe   CLINICAL HISTORY: None provided   PRE-OPERATIVE DIAGNOSIS: None Provided   POST-OPERATIVE DIAGNOSIS: None provided      DIAGNOSIS: A. SUBCARINA; ENDOBRONCHIAL ULTRASOUND-GUIDED BIOPSY: ???FEW ATYPICAL CELLS PRESENT.  B. LUNG, LEFT LOWER LOBE; ENDOBRONCHIAL ULTRASOUND-GUIDED BIOPSY: ???NON-SMALL CELL LUNG CARCINOMA, COMPATIBLE WITH ADENOCARCINOMA.  Note Immunohistochemical stains were performed with the following staining pattern: TTF-1 and Napsin A are positive and p63 is negative, compatible with a diagnosis of non-small cell lung carcinoma. The controls stain appropriately.    GROSS DESCRIPTION: Immediate Assessment: Procedure:  EBUS Cytotechnologist:  Rivka Barbara  Site:  Subcarina  Pass 1: few atypical cells present Site:  Subcarina  Pass 2: few atypical cells present Site:  Left lower lobe, lung Passes  3-9: Lesional tissue present.  Communicated to Dr.Kasa on 05/17/14 by Dr. Luana Shu.  Material submitted: 11 Diff quick stained slides  11 Pap stained slides 2 Touch prep slides   A: Subcarina Gross Description: 1 friable fragment of pale tan cylindrically shaped soft tissue measuring 0.4 cm, sent for cell block in one cassette.  B: Left lower lobe of lung Gross Description: Multiple friable  hemorrhagic fragments of blood clot and soft tissue measuring 3.2 x 0.6 x 0.1 cm in aggregate. Entirely submitted in 13 cassettes.   Final Diagnosis performed by Delorse Lek, MD.  Electronically signed 05/24/2014 5:28:51PM    The electronic signature indicates that the named Attending Pathologist has evaluated the specimen  Technical component performed at [facility], [address], [place], [place] [address] Lab: [phone] Dir: Darrick Penna. Evette Doffing, MD  Professional component performed at [facility], [facility], [place], [place], [place] [address] Lab: [phone] Dir: Dellia Nims. Reuel Derby, MD   Oncology:  03-Nov-15 15:23   Carbohydrate Antigen 19-9 10 [facility] methodology            [facility]            No: [id number]           [address], [place], [place] [address]           Lindon Romp, MD         [phone] Result(s) reported on 04 May 2014 at 05:21PM.)  CA 27.29 (Serial Monitoring)  73.5 [facility]/ACS methodology            [facility]            No: [id number]           [address] [place], [place] [address]           Lindon Romp, MD         [phone] Result(s) reported on 04 May 2014 at 05:21PM.)  CA 27.29 ========== TEST NAME ==========  ========= RESULTS =========  = REFERENCE RANGE =  CA 27.29   Cancer Antigen (CA) 125  51.3 [facility] methodology            [facility]             No: [id number]           [address], [place], [place] [address]           Lindon Romp, MD         [phone] Result(s) reported on 04 May 2014 at 05:21PM.)  Carcinoembryonic Antigen (CEA)  82.6 [facility] methodology       Nonsmokers  <3.9                                                     Smokers     <5.6            [facility]            No: [id number]           [address], [place],  [address]           Lindon Romp, MD         [phone] Result(s)  reported on 04 May 2014 at 05:21PM.)  Carcinoembryonic Antigen ========== TEST NAME ==========  ========= RESULTS =========  = REFERENCE RANGE =  CARCINOEMBRYONIC AB   General Ref:  23-Oct-15 16:23   Acetaminophen, Serum < 2 (10-30 POTENTIALLY TOXIC:  >  200 mcg/mL  > 50 mcg/mL at 12 hr after  ingestion  > 300 mcg/mL at 4 hr after  ingestion)  03-Nov-15 15:23   Protein Immunoelectrophoresis, Serum ========== TEST NAME ==========  ========= RESULTS =========  = REFERENCE RANGE =  PROT IMMUNOELECTROPHORES  IFE and PE, Serum Immunoglobulin G, Qn, Serum     [   1151 mg/dL           ]          [phone] Immunoglobulin A, Qn, Serum     [   230 mg/dL            ]            91-414 Immunoglobulin M, Qn, Serum     [   153 mg/dL            ]            40-230 Protein, Total, Serum           [   6.8 g/dL             ]           6.0-8.5 Albumin                         [L  3.1 g/dL             ]           3.2-5.6 Alpha-1-Globulin                [   0.4 g/dL             ]           0.1-0.4 Alpha-2-Globulin                [   1.0 g/dL             ]           0.4-1.2 Beta Globulin                   [   1.1 g/dL             ]           0.6-1.3 Gamma Globulin                  [   1.2 g/dL             ]           0.5-1.6 M-Spike                         [   Not Observed g/dL    ]      Not Observed Globulin, Total                 [   3.7 g/dL             ]           2.0-4.5 A/G Ratio                  [   0.9                  ]           0.7-2.0 Immunofixation Result, Serum    [   Final Report         ]  An apparent normal immunofixation pattern. Please note:                    [   Final Report         ]            Protein electrophoresis scan will follow via computer, mail, or courier delivery.               [facility]            No: [id number]           [address] [place],  [address]           Lindon Romp, MD   [phone]   Result(s) reported on 04 May 2014 at 05:21PM.  CA-125 ========== TEST NAME ==========  ========= RESULTS =========  = REFERENCE RANGE =  CANCER ANTIGEN 125     15:38   Immunoelectrophoresis Random Urine ========== TEST NAME ==========  ========= RESULTS =========  = REFERENCE RANGE =  URINE IEP, RANDOM  IFE and PE, Random Urine Protein,Total,Urine             [H  31.1 mg/dL           ]          0.0-15.0 Albumin, U                      [   26.0%               ]                   Alpha-1-Globulin, U             [   3.3 %                ]                   Alpha-2-Globulin, U             [   6.3 %                ]                   Beta Globulin, U                [   34.7 %               ]                   Gamma Globulin, U               [   29.7 %               ]                   M-Spike, %                      [   Not Observed %       ]      Not Observed Immunofixation Result, Urine    [   Final Report         ]  An apparent normal immunofixation pattern.   Note:                         [  Final Report         ]                   Protein electrophoresis scan will follow via computer, mail, or courier delivery.               [facility]    No: [id number]           [address], [place], [place] [address]           Lindon Romp, MD         [phone]   Result(s) reported on 06 May 2014 at 06:20AM.  Routine Chem:  23-Oct-15 16:23   Glucose, Serum  104  BUN 16  Creatinine (comp) 1.01  Sodium, Serum  133  Potassium, Serum 3.6  Chloride, Serum 102  CO2, Serum 22  Calcium (Total), Serum 9.8  Anion Gap 9  Osmolality (calc) 268  eGFR (African American) >60  eGFR (Non-African American) >60 (eGFR values <20m/min/1.73 m2 may be an indication of chronic kidney disease (CKD). Calculated eGFR, using the MRDR Study equation, is useful in  patients with stable renal function. The eGFR calculation will not be reliable in acutely ill patients when serum creatinine is changing rapidly. It is  not useful in patients on dialysis. The eGFR calculation may not be applicable to patients at the low and high extremes of body sizes, pregnant women, and vetetarians.)  Lipase  68 (Result(s) reported on 22 Apr 2014 at 07:50PM.)  03-Nov-15 15:23   Glucose, Serum  102  BUN 15  Creatinine (comp) 0.79  Sodium, Serum 138  Potassium, Serum 3.5  Chloride, Serum 99  CO2, Serum 28  Calcium (Total), Serum 10.1  Anion Gap 11  Osmolality (calc) 277  eGFR (African American) >60  eGFR (Non-African American) >60 (eGFR values <670mmin/1.73 m2 may be an indication of chronic kidney disease (CKD). Calculated eGFR, using the MRDR Study equation, is useful in  patients with stable renal function. The eGFR calculation will not be reliable in acutely ill patients when serum creatinine is changing rapidly. It is not useful in patients on dialysis. The eGFR calculation may not be applicable to patients at the low and high extremes of body sizes, pregnant women, and vegetarians.)  24-Nov-15 13:07   Glucose, Serum 90  BUN 10  Creatinine (comp) 0.96  Sodium, Serum 138  Potassium, Serum  3.2  Chloride, Serum 102  CO2, Serum 27  Calcium (Total), Serum 9.1  Anion Gap 9  Osmolality (calc) 274  eGFR (African American) >60  eGFR (Non-African American) >60 (eGFR values <6042min/1.73 m2 may be an indication of chronic kidney disease (CKD). Calculated eGFR, using the MRDR Study equation, is useful in  patients with stable renal function. The eGFR calculation will not be reliable in acutely ill patients when serum creatinine is changing rapidly. It is not useful in patients on dialysis. The eGFR calculation may not be applicable to patients at the low and high extremes of body sizes, pregnant women, and vegetarians.)  25-Nov-15 04:23   Glucose, Serum 84  BUN 10  Creatinine (comp) 0.71  Sodium, Serum 141  Potassium, Serum 3.8  Chloride, Serum 107  CO2, Serum 26  Calcium (Total), Serum 8.8  Anion  Gap 8  Osmolality (calc) 279  eGFR (African American) >60  eGFR (Non-African American) >60 (eGFR values <93m27mn/1.73 m2 may be an indication of chronic kidney disease (CKD). Calculated eGFR, using the MRDR Study equation, is useful in  patients  with stable renal function. The eGFR calculation will not be reliable in acutely ill patients when serum creatinine is changing rapidly. It is not useful in patients on dialysis. The eGFR calculation may not be applicable to patients at the low and high extremes of body sizes, pregnant women, and vegetarians.)  Routine UA:  23-Oct-15 16:24   Color (UA) Yellow  Clarity (UA) Clear  Glucose (UA) Negative  Bilirubin (UA) Negative  Ketones (UA) Negative  Specific Gravity (UA) 1.012  Blood (UA) Negative  pH (UA) 5.0  Protein (UA) Negative  Nitrite (UA) Negative  Leukocyte Esterase (UA) Negative (Result(s) reported on 22 Apr 2014 at 06:48PM.)  RBC (UA) <1 /HPF  WBC (UA) 1 /HPF  Bacteria (UA) NONE SEEN  Epithelial Cells (UA) 1 /HPF  Mucous (UA) PRESENT  Granular Cast (UA) 2 /LPF (Result(s) reported on 22 Apr 2014 at 06:48PM.)  Routine Coag:  03-Nov-15 15:23   Prothrombin 14.4  INR 1.1 (INR reference interval applies to patients on anticoagulant therapy. A single INR therapeutic range for coumarins is not optimal for all indications; however, the suggested range for most indications is 2.0 - 3.0. Exceptions to the INR Reference Range may include: Prosthetic heart valves, acute myocardial infarction, prevention of myocardial infarction, and combinations of aspirin and anticoagulant. The need for a higher or lower target INR must be assessed individually. Reference: The Pharmacology and Management of the Vitamin K  antagonists: the seventh ACCP Conference on Antithrombotic and Thrombolytic Therapy. JFHLK.5625 Sept:126 (3suppl): N9146842. A HCT value >55% may artifactually increase the PT.  In one study,  the increase was an average of  25%. Reference:  "Effect on Routine and Special Coagulation Testing Values of Citrate Anticoagulant Adjustment in Patients with High HCT Values." American Journal of Clinical Pathology 2006;126:400-405.)  Activated PTT (APTT) 29.3 (A HCT value >55% may artifactually increase the APTT. In one study, the increase was an average of 19%. Reference: "Effect on Routine and Special Coagulation Testing Values of Citrate Anticoagulant Adjustment in Patients with High HCT Values." American Journal of Clinical Pathology 2006;126:400-405.)  Routine Hem:  23-Oct-15 16:23   WBC (CBC)  16.9  RBC (CBC) 4.15  Hemoglobin (CBC)  11.4  Hematocrit (CBC) 36.1  Platelet Count (CBC) 352  MCV 87  MCH 27.4  MCHC  31.5  RDW  17.0  Neutrophil % 69.2  Lymphocyte % 20.4  Monocyte % 8.2  Eosinophil % 1.2  Basophil % 1.0  Neutrophil #  11.7  Lymphocyte # 3.4  Monocyte #  1.4  Eosinophil # 0.2  Basophil #  0.2 (Result(s) reported on 22 Apr 2014 at 04:41PM.)  03-Nov-15 15:23   WBC (CBC)  12.4  RBC (CBC) 4.37  Hemoglobin (CBC)  11.9  Hematocrit (CBC) 37.4  Platelet Count (CBC)  451  MCV 86  MCH 27.3  MCHC  31.8  RDW  16.6  Neutrophil % 72.6  Lymphocyte % 17.8  Monocyte % 7.2  Eosinophil % 1.8  Basophil % 0.6  Neutrophil #  9.0  Lymphocyte # 2.2  Monocyte # 0.9  Eosinophil # 0.2  Basophil # 0.1 (Result(s) reported on 03 May 2014 at 03:35PM.)  24-Nov-15 13:07   WBC (CBC)  14.7  RBC (CBC) 4.61  Hemoglobin (CBC) 12.4  Hematocrit (CBC) 38.3  Platelet Count (CBC) 347 (Result(s) reported on 24 May 2014 at 02:34PM.)  MCV 83  MCH 27.0  MCHC 32.4  RDW  16.8  25-Nov-15 04:23   WBC (CBC)  11.3  RBC (CBC)  4.27  Hemoglobin (CBC)  11.6  Hematocrit (CBC) 36.4  Platelet Count (CBC) 314  MCV 85  MCH 27.1  MCHC  31.8  RDW  16.5  Neutrophil % 77.6  Lymphocyte % 12.0  Monocyte % 7.6  Eosinophil % 2.2  Basophil % 0.6  Neutrophil #  8.8  Lymphocyte # 1.4  Monocyte # 0.9  Eosinophil # 0.2   Basophil # 0.1 (Result(s) reported on 25 May 2014 at 05:25AM.)   Assessment and Plan: Impression:   The patient has 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 She is a good candidate for palliative radiation treatment to the L spine/pelvis. Pain is greater on the right, and a lesion superiorly/laterally in the right ischium likely is contributing to her pain as well as the more central disease. do not see anything requiring emergent radiation treatment. The patient's pain is under much better control currently. Plan:   Given the extent of her disease, she will require a 2-3 week course of treatment. She has been scheduled for a simulation Monday morning, likely to begin radiation treatment that day as well. The patient is also being seen by medical oncology and palliative medicine.   Fax to Physician:  Physicians To Recieve Fax: Delight Hoh, MD - [phone].  Electronic Signatures: Jodelle Gross (MD)  (Signed [phone] 11:16)  Authored: HPI, Diagnosis, Allergies, LAB Results, Encounter Assessment and Plan, Fax to Physician   Last Updated: 25-Nov-15 11:16 by Jodelle Gross (MD)

## 2014-10-22 NOTE — Consult Note (Signed)
   Comments   Pt called today as she will be out of her fentanyl tomorrow. She was increased to 42mg last week when she was seen by [place] was using two 226m fentanyl patches to acheive this dose. Her pain is now reported to be better controlled on this dose. Will continue. Plan for follow up in two weeks.  fentanyl transdermal [id number], #10 (script given).   Electronic Signatures: Borders, JosKirt BoysP)  (Signed [phone])  Authored: Palliative Care   Last Updated: 19-Nov-15 16:13 by BorIrean HongP)

## 2014-10-22 NOTE — Consult Note (Signed)
   Comments   Pt called RN to inform her that her previous morphine RX had been lost and she is running out of medication. [place] CSRS reviewed. Pharmacy contacted. Will write new script at previous dosing/frequency as outlined per Dr Ermalinda Memos. Appointment made for next 07/07/13 for patient to be evaluated in the clinic.   Electronic Signatures: Borders, Kirt Boys (NP)  (Signed 30-Dec-15 12:09)  Authored: Palliative Care   Last Updated: 30-Dec-15 12:09 by Irean Hong (NP)

## 2014-10-22 NOTE — Op Note (Signed)
PATIENT NAME:  [name], [name] MR#:  [id number] DATE OF BIRTH:  29-Aug-1965  DATE OF PROCEDURE:  06/21/2014  PREOPERATIVE DIAGNOSIS: Advanced lung cancer, need for central venous access.   POSTOPERATIVE DIAGNOSIS: Advanced lung cancer, need for central venous access.   OPERATIVE PROCEDURE: Left subclavian PowerPort placement with ultrasound and fluoroscopic guidance.   OPERATING SURGEON: Robert Bellow, MD.  ANESTHESIA: Attended local, 10 mL of 1% plain Xylocaine.   ESTIMATED BLOOD LOSS: Minimal.   CLINICAL NOTE: This 49 year old woman has developed lung cancer with bone and brain metastases. She is on adjuvant chemotherapy and central venous access was requested by her treating oncologist.   The patient received Kefzol prior to the procedure.   OPERATIVE NOTE: With the patient comfortably supine on the operating table, the area was prepped with ChloraPrep and draped. Ultrasound was used to confirm patency of the subclavian vein. The above-mentioned local anesthetic was used, and under ultrasound guidance, the needle passed into the subclavian vein, followed by passage of the guidewire and catheter. This was positioned just inside the right atrium, as this gave the best venous return. A silastic catheter was tunneled to a pocket on the left anterior chest. The port was anchored to the deep tissue with two #3-0 Prolene sutures. The catheter was irrigated and aspirated with good blood return. The wound was closed in layers with 3-0 Vicryl to the adipose tissue and a running 4-0 Vicryl subcuticular suture for the skin. The catheter was accessed with a Huber needle, as she is scheduled for her first treatment tomorrow, to minimize discomfort from the anticipated swelling post port placement. The access site was flushed and clamped and capped. A Tegaderm dressing was placed over this, as well as the vein access site.   Erect chest x-ray in the recovery room showed the catheter tip as described  above and no evidence of pneumothorax.   The patient tolerated the procedure well.    ____________________________ Robert Bellow, MD jwb:mw D: 06/21/2014 10:36:21 ET T: 06/21/2014 11:54:23 ET JOB#: [id number]  cc: Robert Bellow, MD, <Dictator> Sarah "Sallie" Posey Pronto, MD Kathlene November. Grayland Ormond, MD  Maryclaire Stoecker Amedeo Kinsman MD ELECTRONICALLY SIGNED 06/22/2014 9:22

## 2014-10-22 NOTE — Consult Note (Signed)
   Comments   Pt will not be able to get radiation treatment until Monday and may discharge today or tomorrow.  Spoke with attending and morphine '15mg'$  q4h prn has been ordered.  Pt is requiring and extra '159mg'$  morphine equivalent on top of fentanyl patch. This total dose of '90mg'$  allows for crosstolerance and will adjust dose as needed.  Will leave dialudid IV dose if pt pain becomes not controlled.  Pt agrees to plan.  Electronic Signatures: Maudry Mayhew (NP)  (Signed [phone] 11:27)  Authored: Palliative Care   Last Updated: 25-Nov-15 11:27 by Maudry Mayhew (NP)

## 2014-10-22 NOTE — H&P (Signed)
PATIENT NAME:  [name], [name] MR#:  [id number] DATE OF BIRTH:  11-15-1965  DATE OF ADMISSION:  05/24/2014  PRIMARY CARE PROVIDER: [facility].   EMERGENCY DEPARTMENT REFERRING PHYSICIAN: Harvest Dark, MD   CHIEF COMPLAINT: Back pain, hip pain.   HISTORY OF PRESENT ILLNESS: The patient is a 49 year old white female with history of multiple medical problems including COPD, tobacco abuse, hypertension and GERD who continues to smoke who has recently been diagnosed with metastatic cancer, felt to be likely due to lung cancer, stage IV, involving multiple areas of her body including bone. The patient was seen by oncology and apparently had a biopsy scheduled as an outpatient which was done; however, I do not see any records of the biopsy results in her chart. She also reports that she had a MRI, which I am not seeing in her records either, who had a follow-up to see Dr. Grayland Ormond today, but on the way to his office she started having severe pain and leg pain. She came to the ED and was noted to have extensive osseous metastatic disease with pathological fractures of the left superior and inferior pubic rami. Therefore, we are asked to admit the patient. The patient reports that the pain has progressively gotten worse. She has been followed by palliative care for pain control as an outpatient and her pain regimen has been adjusted. She otherwise denies any fevers or chills, does have chronic cough and wheezing, also has had some nausea and vomiting. No abdominal pain.   PAST MEDICAL HISTORY: Significant for:  1.  COPD, history of chronic bronchitis.  2.  GERD. 3.  Hypertension.  4.  Metastatic cancer, likely stage IV lung cancer.   PAST SURGICAL HISTORY: Status post tubal ligation, cholecystectomy.   ALLERGIES: None.  HOME MEDICATIONS: Venlafaxine 37.5 mg 2 caps daily, Spiriva 18 mcg daily, Protonix 40 daily, gabapentin 300 one tab p.o. t.i.d., fentanyl 50 mcg every 72 hours, atenolol 100  one tab p.o. daily, acetaminophen/hydrocodone 325/10 one tablet p.o. q. 6 p.r.n. for pain.   FAMILY HISTORY: No history of lung cancer. No history of heart disease.   REVIEW OF SYSTEMS: CONSTITUTIONAL: Denies any fevers. Complains of fatigue and weakness, pain all over. No weight loss or weight gain. EYES: No blurred or double vision. No redness or inflammation. No glaucoma.  ENT: No tinnitus. No ear pain. No hearing loss. No seasonal or year-round allergies.  RESPIRATORY: Complains of cough, intermittent wheezing. No hemoptysis. Has diagnosis of COPD. CARDIOVASCULAR: Denies any chest pain, orthopnea, edema.  GASTROINTESTINAL: Did have some nausea and vomiting, but no abdominal pain. No hematemesis. No melena. No GERD. No IBS or jaundice.  GENITOURINARY: Denies any dysuria, hematuria, renal calculus, or frequency.  ENDOCRINE: Denies any polyuria, nocturia or thyroid problems.  HEMATOLOGIC AND LYMPHATIC: Denies any easy bruisability or bleeding.  SKIN: No acne. No rash.  MUSCULOSKELETAL: Has pain in her back, leg.  NEUROLOGICAL: No numbness, CVA, TIA or seizures.  PSYCHIATRIC: Does have some depression and anxiety. No OCD. No bipolar disorder.   PHYSICAL EXAMINATION: VITAL SIGNS: Temperature 98.2, pulse 89, respirations 22, blood pressure 156/81, O2 95%.  GENERAL: The patient is a frail-looking white female in no acute distress.  HEENT: Head atraumatic, normocephalic. Pupils equally round and reactive to light and accommodation. There is no conjunctival pallor. No scleral icterus. Nasal exam shows no drainage or ulceration. Oropharynx is clear without any exudate.  NECK: Supple without any JVD. No thyromegaly.  CARDIOVASCULAR: Regular rate and rhythm. No  murmurs, rubs, clicks, or gallops.  LUNGS: Clear to auscultation bilaterally without any rales, rhonchi or wheezing.  ABDOMEN: Soft, nontender, nondistended. Positive bowel sounds x4. No hepatosplenomegaly.  EXTREMITIES: No clubbing,  cyanosis, or edema.  SKIN: No rash.  LYMPH NODES: No lymph nodes palpable.  VASCULAR: Good DP and PT pulses.  PSYCHIATRIC: Not anxious or depressed.  NEUROLOGIC: Awake, alert, and oriented x3. No focal deficits.  LYMPH NODES: Nonpalpable. MUSCULOSKELETAL: Limited range of motion in both of the lower extremities, and the hip.  DIAGNOSTIC DATA: X-ray of the right hip shows extensive osseous metastatic disease with pathological fractures of the left superior and inferior pubic rami.  Lumbar spine shows widespread bony metastatic disease, fracture of the left superior pubic rami. No lumbar fracture or spondylolysis.  Admitting WBC count 14.7, hemoglobin 12.4, platelet count 347,000. Glucose 90, BUN 10, creatinine 0.96, sodium 138, potassium 3.2, chloride 102, CO2 27. LFTs are normal, except 2.4.  Right hip shows extensive osseous metastatic disease with pathological fracture of the left superior and inferior pubic rami.   ASSESSMENT AND PLAN: The patient is a 49 year old white female with diagnosis of metastatic cancer, likely stage IV. I am unable to find the pathology report, who is coming in with severe back pain and leg pain.  1.  Intractable back pain and leg pain, difficulty with ambulation, due to metastatic cancer involving multiple bones including pelvic bones. At this time, we will admit the patient for pain control. PT evaluation and treatment. Ortho consult but likely there is no intervention that needs to be done.  2.  Metastatic cancer, likely lung. Will ask for oncology consult to clarify type of cancer and plan for further chemotherapy and radiation.  3.  Chronic obstructive pulmonary disease. Will continue inhalers as taking at home. I will add Combivent and Advair to her current regimen.  4.  Gastroesophageal reflux disease. We will continue Protonix.  5.  Nicotine addiction. Smoking cessation done, 4 minutes spent. Recommend strongly to the patient to stop and she will be started  on a nicotine patch, which she wants to try.  TIME SPENT ON ADMISSION: 60 minutes.  ____________________________ Lafonda Mosses Posey Pronto, MD shp:sb D: 05/24/2014 15:52:02 ET T: 05/24/2014 16:01:20 ET JOB#: [id number]  cc: Anajulia Leyendecker H. Posey Pronto, MD, <Dictator> Alric Seton MD ELECTRONICALLY SIGNED 05/29/2014 13:32

## 2014-10-24 LAB — SURGICAL PATHOLOGY

## 2014-10-30 NOTE — H&P (Signed)
PATIENT NAME:  [name], [name] MR#:  [id number] DATE OF BIRTH:  December 08, 1965  DATE OF ADMISSION:  07/23/2014  PRIMARY CARE PHYSICIAN: Denton Lank, MD   PRIMARY ONCOLOGIST: Kathlene November. Grayland Ormond, MD   REFERRING PHYSICIAN: Algis Liming. Jimmye Norman, MD   CHIEF COMPLAINT: Shortness of breath, low oxygen saturations.   HISTORY OF PRESENT ILLNESS: Ms. 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 Has been undergoing chemotherapy. Comes to the Emergency Department with complaints of low oxygen saturations, tachycardia, shortness of breath since this morning. The patient's last chemotherapy was on 07/21/2014. This is the third cycle of chemotherapy without much success. Per family, the patient has extremely poor functional status. Having some cough without much productive sputum. The patient's voice is very weak. The patient's mother, who is at bedside, gave the history. Chest x-ray showed multiple infiltrates bilaterally. The patient received vancomycin and Zosyn; considering the patient's recent chemotherapy, treating as a healthcare-associated pneumonia. The patient has normal white blood cell count; however, has a left shift of 90%.   PAST MEDICAL HISTORY:  1.  COPD.  2.  Gastroesophageal reflux disease.  3.  Hypertension.   PAST SURGICAL HISTORY:  1.  Tubal ligation.  2.  Cholecystectomy.   ALLERGIES: No known drug allergies.   HOME MEDICATIONS:  1.  Fentanyl 50 mcg transdermal.  2.  Morphine 50 mg every 2 hours as needed.  3.  Compazine 10 mg every 6 hours as needed.  4.  ProAir 2 puffs 4 times a day.  5.  Gabapentin 300 mg 3 times a day.  6.  Protonix 40 mg daily.  7.  Atenolol 100 mg once daily. 8.  Venlafaxine 37.5 mg 2 capsules 2 times a day.  9.  Dexamethasone 1 tablet 3 times a day.  10.  Magic mouthwash.  11.  Mucinex 600 mg extended release.   SOCIAL HISTORY: Smoked for 40 years. Currently lives with  her husband, daughter.   FAMILY HISTORY: Negative for any early coronary artery disease or lung cancers.   REVIEW OF SYSTEMS:  CONSTITUTIONAL: Experiencing severe generalized weakness.  EYES: No change in vision.  ENT: No change in hearing.  RESPIRATORY: Has cough, shortness of breath.  CARDIOVASCULAR: Has been feeling palpitations.  GASTROINTESTINAL: Poor appetite. Constipation.  GENITOURINARY: No dysuria or hematuria.  HEMATOLOGIC: No easy bruising or bleeding.  SKIN: No rash or lesions.  MUSCULOSKELETAL: Has generalized body aches.  NEUROLOGIC: No weakness or numbness in any part of the body.   PHYSICAL EXAMINATION:  GENERAL: This is a thin-built, frail-looking female, looks much older than the stated age, lying down in the bed, looks lethargic.  VITAL SIGNS: Temperature 97.5, pulse 92, blood pressure 120/92, respiratory rate of 15, oxygen saturation is 93% on 2 L of oxygen.  HEENT: Normocephalic, atraumatic. There is no scleral icterus. Conjunctivae normal. Pupils equal and react to light. Mucous membranes dry. No pharyngeal erythema.  NECK: Supple. No lymphadenopathy. No JVD. No carotid bruit.  CHEST: Has no focal tenderness. Bilateral coarse breath sounds. Bilateral diffuse wheezing.  HEART: S1, S2 regular. No murmurs are heard.  ABDOMEN: Bowel sounds plus. Soft, nontender, nondistended. No hepatosplenomegaly.  EXTREMITIES: No pedal edema. Pulses 2+.  SKIN: Has dry skin.  MUSCULOSKELETAL: Good range of motion in all the extremities. NEUROLOGIC: The patient is alert, oriented to place, person, and time.   LABORATORY DATA: CBC: WBC of 7.3, hemoglobin 14.6, platelet count of 116,000. BMP  is completely within normal limits. Chest x-ray, PA and lateral: Irregular interstitial opacities in the lower lungs, most evident in the left lower lobe, superimposed on mild diffuse interstitial thickening. Interstitial infiltrates which may be infectious or inflammatory.   ASSESSMENT AND PLAN:  Ms. Jetter, a 49 year old female with stage IV lung cancer, who comes with pneumonia.  1.  Pneumonia: Treat it as a healthcare-associated pneumonia with vancomycin and Zosyn. Continue with the DuoNebs and Solu-Medrol. Blood cultures have been obtained in the Emergency Department.  2.  Chronic obstructive pulmonary disease exacerbation: Continue with the breathing treatments and Solu-Medrol.  3.  Hypoxic respiratory failure: Continue with oxygen.  4.  Dehydration: Continue with intravenous fluids.  5.  Stage IV lung cancer: The patient is currently receiving chemotherapy.  6.  Severe debility: Continue with physical therapy. The patient has a poor functional status. Palliative care was offered by oncology.  7.  Hypertension: We will hold the blood pressure medications for now.  8.  Keep the patient on deep vein thrombosis prophylaxis with Lovenox.   TIME SPENT: 55 minutes.   ____________________________ Monica Becton, MD pv:ST D: 07/23/2014 23:11:41 ET T: 07/24/2014 00:28:38 ET JOB#: [id number]  cc: Monica Becton, MD, <Dictator> Sarah "Sallie" Posey Pronto, MD Monica Becton MD ELECTRONICALLY SIGNED 07/26/2014 4:33

## 2014-10-30 NOTE — Consult Note (Signed)
PATIENT NAME:  [name], [name] MR#:  [id number] DATE OF BIRTH:  [birth year]  DATE OF CONSULTATION:  07/25/2014  REASON FOR CONSULTATION: Cavitary pneumonia.   HISTORY OF PRESENT ILLNESS: This is an unfortunate 49 year old female, history of widely metastatic lung cancer, diagnosed in November 2015. She has brain mets and bone mets. She has been undergoing radiation and chemotherapy. She was admitted July 23, 2014 with worsening shortness of breath. The patient is in a very debilitated state. She was admitted when her x-ray showed likely bilateral pneumonia. She has been started on vancomycin and Zosyn and clinically is improving since. She has been seen by palliative care, and is apparently not much of a candidate for further chemotherapy.   PAST MEDICAL HISTORY:  1. Lung cancer stage IV, widely metastatic, undergone chemoradiation, diagnosed initially in November of 2015.  2. Chronic obstructive pulmonary disease.  3. GERD.  4. Hypertension.   PAST SURGICAL HISTORY:  1. Tubal ligation.  2. Cholecystectomy.  3. Placement of left chest port.   ALLERGIES: NO KNOWN DRUG ALLERGIES.   ANTIBIOTICS SINCE ADMISSION: Include levofloxacin, Zosyn, and vancomycin. She is also on methylprednisolone.   SOCIAL HISTORY: The patient lives with her husband and daughter. She is a smoker. Smoked for 40 years. No alcohol.   FAMILY HISTORY: Noncontributory. Review of systems 11 systems reviewed and negative, except for history of present illness.   PHYSICAL EXAMINATION:  VITAL SIGNS: Temperature 97.7, pulse 97, blood pressure 135/91, respirations 19, saturation 99% on 2.5 liters.  GENERAL: She is quite frail and appears much older than stated age. She is on oxygen, but not in any marked respiratory distress.  HEENT: Pupils are reactive. Sclerae are anicteric.  OROPHARYNX: Clear, except for somewhat dry.  HEART: Regular.  LUNGS: Coarse breath sounds bilateral bases and some expiratory wheeze.  ABDOMEN:  Soft, nontender, nondistended. No hepatosplenomegaly.  EXTREMITIES: No clubbing, cyanosis or edema.  NEUROLOGIC: She is awake and interactive.   LABORATORY DATA: White blood count on admission was 11.0. Currently, it is 4.7. Hemoglobin 12.2, platelets 89,000. Renal function shows a creatinine of 0.22, BUN of 6. LFTs show a low albumin at 2.4, alkaline phosphatase 197, AST 39, ALT 53. Troponin is negative. Blood cultures July 23, 2014, 2 of 2 negative. Urinalysis July 23, 2014 negative.   IMAGING: A CT scan done July 23, 2014 shows no pulmonary embolism. There is new cavitation in the left lower lobe with associated air fluid level at the previously noted location of perihilar left lobar mass. There is soft tissue density tracking along the left  hilum with worsening narrowing at the left lower lobe bronchus and minimally associated endobronchial debris with new patchy peribronchial vascular opacity within the left lower lobe and upper lobe, suspicious for associated pneumonia. There is debris within the bronchus and innumerable pulmonary nodules.   IMPRESSION: A 49 year old female undergoing chemotherapy and radiation for advanced stage IV metastatic lung cancer, admitted with worsening shortness of breath. She was found to have what appears to be multifocal pneumonia, as well as a now necrotizing or cavitary lung mass. She has no history of tuberculosis or tuberculosis contacts. I suspect this is mainly progression of her pulmonary process with possible lymphangitic spread. There may be a superimposed pneumonia, although she has been afebrile.   RECOMMENDATIONS:  1. Check sputum if she is able to produce it.  2. If she is clinically stable for the next 24 to 48 hours, I would recommend transitioning to oral Augmentin 875 twice a  day for a 14 to 21 day course, depending on response.   Thank you for the consult. I will be glad to follow with you.  ____________________________ Cheral Marker.  Ola Spurr, MD dpf:ap D: 07/25/2014 48:01:65 ET T: 07/25/2014 21:51:24 ET JOB#: [id number]  cc: Cheral Marker. Ola Spurr, MD, <Dictator> Shantale Holtmeyer Ola Spurr MD ELECTRONICALLY SIGNED 07/26/2014 9:54

## 2014-10-30 NOTE — Discharge Summary (Signed)
PATIENT NAME:  [name] MR#:  [id number] DATE OF BIRTH:  Jan 02, 1966  DATE OF ADMISSION:  07/23/2014 DATE OF DISCHARGE:  07/27/2014  FINAL DIAGNOSES: 1.  Acute respiratory failure, likely chronic now.  2.  Bilateral pneumonia and left lower lung abscess.  3.  Chronic obstructive pulmonary disease exacerbation.  4.  Metastatic lung cancer to brain and bone.  5.  Hypertension.  6.  Hypokalemia and hypomagnesemia.  7.  Chronic pain syndrome.   MEDICATIONS ON DISCHARGE: Include gabapentin 300 mg 1 capsule 3 times a day, Protonix 40 mg daily, Compazine 10 mg every 6 hours as needed for nausea, atenolol 100 mg daily, Effexor 37.5 mg extended release 2 capsules twice a day, Spiriva 1 inhalation daily, nystatin topical powder applied to affected area 3 times a day, Philips' stool softener 100 mg 1 capsule twice a day, fentanyl patch extended release 50 mcg every 48 hours, morphine 15 mg oral tablet every 2 hours as needed for pain, dexamethasone 4 mg 3 times a day, Roxanol 20 mg/mL 0.25 mL every 2 hours as needed for shortness of breath or pain, DuoNeb nebulizer solution 3 mL every 6 hours, Ensure Plus 237 mL 3 times a day, Augmentin 875 mg twice a day for 21 days.   OXYGEN: Yes, 3 liters.   DIET: Low-sodium diet with Ensure 3 times a day, regular consistency.   ACTIVITY: As tolerated. The patient will be discharged home with hospice.   HOSPITAL COURSE: The patient was admitted 07/23/2014 and discharged 07/27/2014. The patient came in with shortness of breath and low oxygen saturation, was admitted with pneumonia believed to be healthcare associated pneumonia, was initially started on vancomycin and Zosyn and Levaquin was added. The patient was started on Solu-Medrol for COPD exacerbation and oxygen for acute respiratory failure.   LABORATORY AND RADIOLOGICAL DATA DURING THE HOSPITAL COURSE: Included an EKG that showed sinus tachycardia, left atrial enlargement, nonspecific ST-T wave changes.  Chest x-ray showed irregular interstitial opacities in the lower lungs most evident in the left lower lobe. BNP 1756. Troponin negative. Glucose 130, BUN 10, creatinine 0.35, sodium 139, potassium 3.6, chloride 103, CO2 of 26, calcium 7.2, alkaline phosphatase 197, ALT 53, AST 39, total protein 5.6, albumin 2.4. White blood cell count 10.3, hemoglobin and hematocrit 14.6 and 44.5, platelet count 116,000. Blood cultures were negative. CT scan of the chest showed no pulmonary embolism, new cavitation left lower lobe associated with air fluid levels, underlying malignancy versus infection, soft tissue density within the left hilum, narrowing of the left lower lobe bronchus, likely endobronchial debris, new opacity left lower lobe and left upper lung suspicious for pneumonia, innumerable pulmonary nodules again noted, sclerotic and lytic metastatic lesions throughout the osseous structures, new pathologic fracture of the right posterior 9th rib. Urinalysis negative. Troponin negative. Magnesium on the 26th of 1.7, potassium 2.7. Potassium upon discharge was 3.4, magnesium 2.2, white count 4.6, hemoglobin 12.1, platelet count of 83,000.   [place] COURSE: Included palliative care team, Dr. Ermalinda Memos, infectious disease Dr. Ola Spurr, physical therapy, and Dr. Grayland Ormond oncology.    HOSPITAL COURSE PER PROBLEM LIST:  1.  For the patient's acute respiratory failure, likely this is chronic. At this point, the patient was sent home with hospice with oxygen supplementation.  2.  Bilateral pneumonia and left lower lung abscess. The patient was on triple antibiotics with vancomycin, Zosyn, and Levaquin switched over to Augmentin for a 21 day course at home.  3.  For the patient's  COPD exacerbation, the patient was started on nebulizer treatments, IV Solu-Medrol, and will be sent home on usual Decadron. The patient was moving better air upon discharge.  4.  Metastatic lung cancer to brain and bone.  Overall prognosis is poor. The patient was made a do not resuscitate. The patient accepted hospice at home. Goal of care is comfort at this point.  5.  Hypertension. Blood pressure borderline upon discharge. Continue the atenolol.  6.  Hypokalemia and hypomagnesemia. This was treated during the hospital course with IV and oral medications.  7.  Chronic pain syndrome. Continue fentanyl patch, short-acting morphine, and Roxanol.   TIME SPENT ON DISCHARGE: 35 minutes    ____________________________ Tana Conch. Leslye Peer, MD rjw:at D: 07/28/2014 11:55:25 ET T: 07/28/2014 17:01:09 ET JOB#: [id number]  cc: Tana Conch. Leslye Peer, MD, <Dictator> Sarah "Sallie" Posey Pronto, MD Efraim Kaufmann, MD Kathlene November. Grayland Ormond, MD Cheral Marker. Ola Spurr, MD  Marisue Brooklyn MD ELECTRONICALLY SIGNED 07/29/2014 14:43

## 2014-10-30 NOTE — Consult Note (Signed)
Note Type Consult   Subjective: Chief Complaint/Diagnosis:   stage IV lung cancer, now with pneumonia. Declining performance status. HPI:   Patient shortness of breath and cough are essentially unchanged. Pain is better controlled. She has no neurologic complaints. She denies any nausea, vomiting, constipation, or diarrhea. She has no melena or hematochezia. She has no urinary complaints. Patient offers no further specific complaints.   Review of Systems:  Performance Status (ECOG): 3  Review of Systems:   As per HPI. Otherwise, 10 point system review was negative.   Allergies:  No Known Allergies:   Smoking History: Smoking History 1 40 years.  PFSH: Additional Past Medical and Surgical History: COPD, GERD, hypertension, tubal ligation, cholecystectomy.    Family history: negative and noncontributory.    Social history:  Tobacco as above, denies alcohol.   Home Medications: Medication Instructions Last Modified Date/Time  Spiriva 18 mcg inhalation capsule 1 cap(s) inhaled once a day 24-Jan-16 00:31  fentaNYL 50 mcg/hr transdermal film, extended release 1 patch transdermal every 48 hours 24-Jan-16 00:31  morphine 15 mg oral tablet 1 tab(s) orally every 2 hours, As Needed - for Pain 24-Jan-16 00:31  Compazine tablet 10 mg 1 tab(s) orally every 6 hours x 30 days as needed   24-Jan-16 00:31  ProAir HFA 90 mcg/inh inhalation aerosol 2 puff(s) inhaled 4 times a day, As Needed - for Shortness of Breath 24-Jan-16 00:31  gabapentin 300 mg oral capsule 1 cap(s) orally 3 times a day 24-Jan-16 00:31  pantoprazole 40 mg oral delayed release tablet 1 tab(s) orally once a day 24-Jan-16 00:31  atenolol 100 mg oral tablet 1 tab(s) orally once a day (in the morning) 24-Jan-16 00:31  venlafaxine 37.5 mg oral capsule, extended release 2 cap(s) orally 2 times a day 24-Jan-16 00:31  dexamethasone 1 tab(s) orally 3 times a day (per daughter these are 4 mg tablets) 24-Jan-16 00:31  Mucinex 600 mg  oral tablet, extended release 1 tab(s) orally every 12 hours, As Needed for mucous drainage 24-Jan-16 00:31  nystatin topical 100000 units/g topical powder Apply topically to affected area 3 times a day 24-Jan-16 00:31  Phillips Stool Softener sodium 100 mg oral capsule 1 cap(s) orally 2 times a day 24-Jan-16 00:31  albuterol-ipratropium 4 gram(s) inhaled every 4 hours, As Needed - for Shortness of Breath 24-Jan-16 00:31   Malnutrition Degree:  Malnutrition Degree Severe (protein-calorie malnutrition, severe)  in the context of chronic illness (1)   Physician Acknowledgement: I reviewed the dietitian documentation and I agree with the malnutrition degree.   Physical Exam:  General: ill-appearing, no acute distress.  Mental Status: normal affect  Eyes: anicteric sclera  Respiratory: diminished breath sounds, scattered rhonchi. Anterior exam only.  Cardiovascular: regular rate and rhythm, no murmur, rub, or gallop  Gastrointestinal: soft, nondistended, nontender, no organomegaly.  normal active bowel sounds  Musculoskeletal: No edema, ecchymosis surrounding port on chest wall is improved.  Skin: No rash or petechiae noted  Neurological: lethargic, but arousable   Laboratory Results: Routine Chem:  26-Jan-16 05:15   Glucose, Serum  161  BUN  3  Creatinine (comp)  0.27  Sodium, Serum 140  Potassium, Serum  2.7  Chloride, Serum 102  CO2, Serum 29  Calcium (Total), Serum  6.6  Anion Gap 9  Osmolality (calc) 279  eGFR (African American) >60  eGFR (Non-African American) >60 (eGFR values <48m/min/1.73 m2 may be an indication of chronic kidney disease (CKD). Calculated eGFR, using the MRDR Study equation, is useful in  patients with stable renal function. The eGFR calculation will not be reliable in acutely ill patients when serum creatinine is changing rapidly. It is not useful in patients on dialysis. The eGFR calculation may not be applicable to patients at the low and high  extremes of body sizes, pregnant women, and vegetarians.)  Result Comment CALCIUM - RESULTS VERIFIED BY REPEAT TESTING.  - NOTIFIED OF CRITICAL VALUE  - C/ JACKIE PAGE _0  07-26-14.Marland KitchenAJO  - READ-BACK PROCESS PERFORMED.  Result(s) reported on 26 Jul 2014 at 06:07AM.  Magnesium, Serum  1.7 (1.8-2.4 THERAPEUTIC RANGE: 4-7 mg/dL TOXIC: > 10 mg/dL  -----------------------)  Routine Hem:  26-Jan-16 05:15   WBC (CBC)  2.9  RBC (CBC) 4.08  Hemoglobin (CBC)  11.1  Hematocrit (CBC)  33.3  Platelet Count (CBC)  86  MCV 82  MCH 27.3  MCHC 33.5  RDW  20.3  Neutrophil % 94.4  Lymphocyte % 4.4  Monocyte % 0.7  Eosinophil % 0.1  Basophil % 0.4  Neutrophil # 2.7  Lymphocyte #  0.1  Monocyte #  0.0  Eosinophil # 0.0  Basophil # 0.0 (Result(s) reported on 26 Jul 2014 at [facility].)   Assessment and Plan: Impression:   stage IV lung cancer, now with pneumonia. Declining performance status. Plan:   1. Lung cancer: Patient last had chemotherapy on July 21, 2014. Her performance status is declining and no further treatments are planned. Patient and family had lengthy discussion with palliative care today and have her agreed to be discharged home with hospice.Pain: Continue current narcotic regimen. Appreciate palliative care input. Pneumonia: Continue current antibiotic treatment.White blood cell count: Patient's white blood cells is declining secondary to chemotherapy. Will give 1 injection of Neupogen today and one tomorrow prior to discharge. Hypokalemia: Replacing electrolytes as needed. consult, will follow.  Fax to Physician:  Physicians To Recieve Fax: [facility] Lank [facility]) - [phone].  Tumor Staging:  Tumor Staging Tumor Staging   Tumor Staging Source Clinical   Tumor T3   Node N3   Metastasis M1   Stage IV   Cancer Status Evidence of disease clinically   Treatment Plan (Initial Course):  Intent Palliation   Consent Treatment plan was discussed with patient,  patient consented to chemotherapy to treat cancer.   Regimen See Orders for Treatment Regimen  According to NCCN Guidelines   Regimen carboplatinum AUC 5, Taxol 175 mg/m2, Avastin 15 mg/kg every 3 weeks. Will reimage after 4 cycles.   Diagnosis:  Date of Diagnosis 17-May-2014   Initial Pathology Report Date 17-May-2014   Final Pathology Report Date 17-May-2014   Metastatic Disease:  Type NCCLC   Carboplatin or Cisplatin plus second Cytotoxic Drug Administered? Yes   If Squamous Cell histology Bevacizumab administered? No   Advance Directive:  Forensic scientist Theatre stage manager) no   Advance Directive Information Given patient refused   Electronic Signatures: Delight Hoh (MD)  (Signed 26-Jan-16 15:00)  Authored: Note Type, CC/HPI, Review of Systems, ALLERGIES, Smoking Cessation, Patient Family Social History, HOME MEDICATIONS, Dietitian Notes, Physical Exam, Lab Results Review, Assessment and Plan, Fax to Physician, Quality Measures, Advance Directive   Last Updated: 26-Jan-16 15:00 by Delight Hoh (MD)  References: 1.  Data Referenced From "[place] Adult Nutrition Assessment" 25-Jul-2014 12:27 PM

## 2014-10-30 NOTE — Consult Note (Signed)
Note Type Consult   Subjective: Chief Complaint/Diagnosis:   stage IV lung cancer, now with pneumonia. Declining performance status. HPI:   Patient is a 49 year old female who recently received chemotherapy for her stage IV adenocarcinoma of the lung. She has a declining performance status and was admitted with worsening shortness of breath and cough and found to have pneumonia. Patient states she is improved from admission. She continues to have cough and shortness of breath.  Her performance status has declined. Pain is better controlled. She has no neurologic complaints. She denies any nausea, vomiting, constipation, or diarrhea. She has no melena or hematochezia. She has no urinary complaints. Patient offers no further specific complaints.   Review of Systems:  Performance Status (ECOG): 3  Review of Systems:   As per HPI. Otherwise, 10 point system review was negative.   Allergies:  No Known Allergies:   Smoking History: Smoking History 1 40 years.  PFSH: Additional Past Medical and Surgical History: COPD, GERD, hypertension, tubal ligation, cholecystectomy.    Family history: negative and noncontributory.    Social history:  Tobacco as above, denies alcohol.   Home Medications: Medication Instructions Last Modified Date/Time  Spiriva 18 mcg inhalation capsule 1 cap(s) inhaled once a day 24-Jan-16 00:31  fentaNYL 50 mcg/hr transdermal film, extended release 1 patch transdermal every 48 hours 24-Jan-16 00:31  morphine 15 mg oral tablet 1 tab(s) orally every 2 hours, As Needed - for Pain 24-Jan-16 00:31  Compazine tablet 10 mg 1 tab(s) orally every 6 hours x 30 days as needed   24-Jan-16 00:31  ProAir HFA 90 mcg/inh inhalation aerosol 2 puff(s) inhaled 4 times a day, As Needed - for Shortness of Breath 24-Jan-16 00:31  gabapentin 300 mg oral capsule 1 cap(s) orally 3 times a day 24-Jan-16 00:31  pantoprazole 40 mg oral delayed release tablet 1 tab(s) orally once a day  24-Jan-16 00:31  atenolol 100 mg oral tablet 1 tab(s) orally once a day (in the morning) 24-Jan-16 00:31  venlafaxine 37.5 mg oral capsule, extended release 2 cap(s) orally 2 times a day 24-Jan-16 00:31  dexamethasone 1 tab(s) orally 3 times a day (per daughter these are 4 mg tablets) 24-Jan-16 00:31  Mucinex 600 mg oral tablet, extended release 1 tab(s) orally every 12 hours, As Needed for mucous drainage 24-Jan-16 00:31  nystatin topical 100000 units/g topical powder Apply topically to affected area 3 times a day 24-Jan-16 00:31  Phillips Stool Softener sodium 100 mg oral capsule 1 cap(s) orally 2 times a day 24-Jan-16 00:31  albuterol-ipratropium 4 gram(s) inhaled every 4 hours, As Needed - for Shortness of Breath 24-Jan-16 00:31   Malnutrition Degree:  Malnutrition Degree Severe (protein-calorie malnutrition, severe)  in the context of chronic illness (1)   Physician Acknowledgement: I reviewed the dietitian documentation and I agree with the malnutrition degree.   Vital Signs:  :: vital signs stable, patient afebrile.   Physical Exam:  General: ill-appearing, no acute distress.  Mental Status: lethargic, but alert and answering questions appropriately  Eyes: anicteric sclera  Respiratory: diminished breath sounds, scattered rhonchi. Anterior exam only.  Cardiovascular: regular rate and rhythm, no murmur, rub, or gallop  Gastrointestinal: soft, nondistended, nontender, no organomegaly.  normal active bowel sounds  Musculoskeletal: No edema, ecchymosis surrounding port on chest wall is improved.  Skin: No rash or petechiae noted  Neurological: lethargic, but arousable   Laboratory Results: Routine Chem:  24-Jan-16 05:04   Glucose, Serum  106  BUN  6  Creatinine (  comp)  0.22  Sodium, Serum 139  Potassium, Serum  3.2  Chloride, Serum 104  CO2, Serum 26  Calcium (Total), Serum  6.6  Anion Gap 9  Osmolality (calc) 276  eGFR (African American) >60  eGFR (Non-African American)  >60 (eGFR values <62m/min/1.73 m2 may be an indication of chronic kidney disease (CKD). Calculated eGFR, using the MRDR Study equation, is useful in  patients with stable renal function. The eGFR calculation will not be reliable in acutely ill patients when serum creatinine is changing rapidly. It is not useful in patients on dialysis. The eGFR calculation may not be applicable to patients at the low and high extremes of body sizes, pregnant women, and vegetarians.)  Result Comment CALCIUM - RESULTS VERIFIED BY REPEAT TESTING.  - NOTIFIED OF CRITICAL VALUE  - LAB/JACKIE PAGE AT 050091/24/16  - READ-BACK PROCESS PERFORMED.  Result(s) reported on 24 Jul 2014 at 06:13AM.  Routine Hem:  24-Jan-16 05:04   WBC (CBC) 4.7  RBC (CBC) 4.41  Hemoglobin (CBC) 12.2  Hematocrit (CBC) 37.2  Platelet Count (CBC)  89  MCV 84  MCH 27.7  MCHC 32.8  RDW  20.7  Neutrophil % 95.2  Lymphocyte % 3.5  Monocyte % 0.9  Eosinophil % 0.2  Basophil % 0.2  Neutrophil # 4.5  Lymphocyte #  0.2  Monocyte #  0.0  Eosinophil # 0.0  Basophil # 0.0 (Result(s) reported on 24 Jul 2014 at 06:13AM.)   Medical Imaging Results:   Review Medical Imaging   PA and Lateral 23-Jul-2014 18:55:00: IMPRESSION:  Irregular interstitial opacities in the lower lungs most evident in  the left lower lobe superimposed on mild diffuse interstitial  thickening. Interstitial infiltrate which may be infectious or  inflammatory is suspected. No convincing pulmonary edema.      Electronically Signed    By: DLajean ManesM.D.    On: 07/23/2014 18:59         Verified By: DLasandra Beech M.D., ANGIOGRAPHY Chest with for PE 23-Jul-2014 20:45:00: IMPRESSION:  1. No evidence of pulmonary embolus.  2. New cavitation noted in the left lower lobe, with associated  air-fluid levels, at the previously noted location of the perihilar  left lower lobe mass. The extent of underlying malignancy is not  well assessed; underlying atypical infection cannot  be excluded.  3. Soft tissue density tracking about the left hilum, with worsening  narrowing of the left lower lobe bronchus and minimal associated  endobronchial debris. New patchy peribronchovascular opacity within  the left lower and upper lung lobes is suspicious for associated  pneumonia. Additional scattered hazy airspace opacities throughout  both lungs, concerning for an acute infectious process, possibly  atypical in nature.  4. Debris suggested within the bronchusto the right lower lung  lobe.  5. Innumerable pulmonary nodules again noted, though perhaps  slightly less prominent than on the prior study. New underlying  interstitial prominence noted.  6. Interval mixed sclerotic and lytic metastatic lesions throughout  the visualized osseous structures. New pathologic fracture of the  right posterior ninth rib, with an underlying partially sclerotic  and partially lytic lesion.  7. Scattered coronary artery calcifications seen.  8. Mild soft tissue inflammation noted superficial to the left-sided  chest port. Chest port otherwise unremarkable.      Electronically Signed    By: JGarald BaldingM.D.    On: 07/23/2014 21:06         Verified By: JEFFREY . CRadene Knee M.D.,  Assessment  and Plan: Impression:   stage IV lung cancer, now with pneumonia. Declining performance status. Plan:   1. Lung cancer: Patient last had chemotherapy on July 21, 2014. Her performance status is declining and no further treatments are planned. Patient and family had lengthy discussion with palliative care today and have her agreed to be discharged home with hospice.Pain: Continue current narcotic regimen. Appreciate palliative care input. Pneumonia: Continue current antibiotic treatment.White blood cell count: Patient's white blood cells are within normal limits today, but she may potentially get neutropenic near the end of the week. consult, will follow.  Fax to Physician:  Physicians To Recieve Fax: [facility] Lank [facility])  - [phone].  Tumor Staging:  Tumor Staging Tumor Staging   Tumor Staging Source Clinical   Tumor T3   Node N3   Metastasis M1   Stage IV   Cancer Status Evidence of disease clinically   Treatment Plan (Initial Course):  Intent Palliation   Consent Treatment plan was discussed with patient, patient consented to chemotherapy to treat cancer.   Regimen See Orders for Treatment Regimen  According to NCCN Guidelines   Regimen carboplatinum AUC 5, Taxol 175 mg/m2, Avastin 15 mg/kg every 3 weeks. Will reimage after 4 cycles.   Diagnosis:  Date of Diagnosis 17-May-2014   Initial Pathology Report Date 17-May-2014   Final Pathology Report Date 17-May-2014   Metastatic Disease:  Type NCCLC   Carboplatin or Cisplatin plus second Cytotoxic Drug Administered? Yes   If Squamous Cell histology Bevacizumab administered? No   Advance Directive:  Forensic scientist Theatre stage manager) no   Advance Directive Information Given patient refused   Electronic Signatures: Delight Hoh (MD)  (Signed 25-Jan-16 16:42)  Authored: Note Type, CC/HPI, Review of Systems, ALLERGIES, Smoking Cessation, Patient Family Social History, HOME MEDICATIONS, Dietitian Notes, Vital Signs, Physical Exam, Lab Results Review, Rad Results Review, Assessment and Plan, Fax to Physician, Quality Measures, Advance Directive   Last Updated: 25-Jan-16 16:42 by Delight Hoh (MD)  References: 1.  Data Referenced From "[place] Adult Nutrition Assessment" 25-Jul-2014 12:27

## 2014-10-30 NOTE — Consult Note (Signed)
   Comments   Was called by radiation RN given family question about pt's pain medications. Daughter reports confusion following increased fentanyl and adjustment of steroids. Once confusion started, family began cutting her 18mg patches in half in an attempt to reduce pt's dose back to 1539m. They have continued to change this every 48 hours. I discouraged cutting of the patches given the possibility for inacurrate dosing. I wrote a new script for 5032mfentanyl patches to be used with their existing 100m60matches to equal total dose of 150mc21m8 hours. Pt generally says her pain has improved and she is only requiring prn morphine once every day or two.  fentanyl to 150mcg60mry 48 hours (script given for 50mcg 64mhes, #8, to equal this dose)in 1-2 weeks discussed with Dr Phifer Ermalinda [employer] Signatures: Borders, Joshua Kirt Boys(Signed 13-Jan-16 15:54)  Authored: Palliative Care   Last Updated: 13-Jan-16 15:54 by BordersIrean Hong

## 2014-10-30 NOTE — Consult Note (Signed)
   Comments   I met with pt's husband, daughter and mother. Updated them on pt's current condition. Explained that pt is not a candidate for further chemotherapy. I suggested the involvement of home hospice and they are in agreement with this. I spoke with hospice liason who will see pt (pt uninsured so will use non-profit hospice, no hospice screen needed).  discussed code status. Husband wants pt to be a DNR and rest of family agrees. Order entered. Out-of-facility DNR completed and placed in chart.   Electronic Signatures: Hildegarde Dunaway, Izora Gala (MD)  (Signed 25-Jan-16 15:07)  Authored: Palliative Care   Last Updated: 25-Jan-16 15:07 by Izza Bickle, Izora Gala (MD)

## 2015-12-23 IMAGING — CT CT ANGIO CHEST
2 of 6 series · 17 of 36 positions shown · IV contrast (APPLIED)
Comparison: Chest radiograph performed earlier today at [DATE] p.m.,
and CT of the chest performed 05/06/2014

CLINICAL DATA: Hypoxia on room air. Current history of stage IV
lung cancer, with recent chemotherapy. Initial encounter.

EXAM:
CT ANGIOGRAPHY CHEST WITH CONTRAST
TECHNIQUE: Multidetector CT imaging of the chest was performed using the
standard protocol during bolus administration of intravenous
contrast. Multiplanar CT image reconstructions and MIPs were
obtained to evaluate the vascular anatomy.
CONTRAST:  50 mL of Omnipaque 350 IV contrast

[Series 5: pe 1.0 thins · axial · 0.64mm/px · z∈[-247,-28]mm · 16 of 247 slices shown]
[im 14/247  lung]
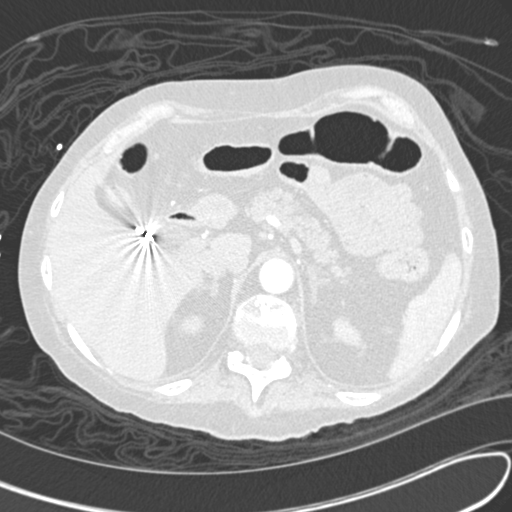
[im 28/247  mediastinal]
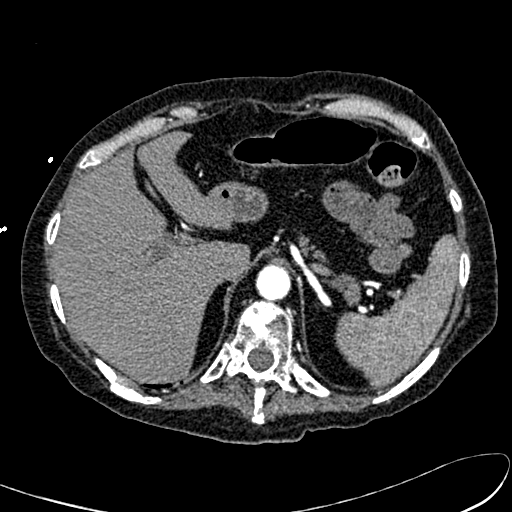
[im 42/247  lung]
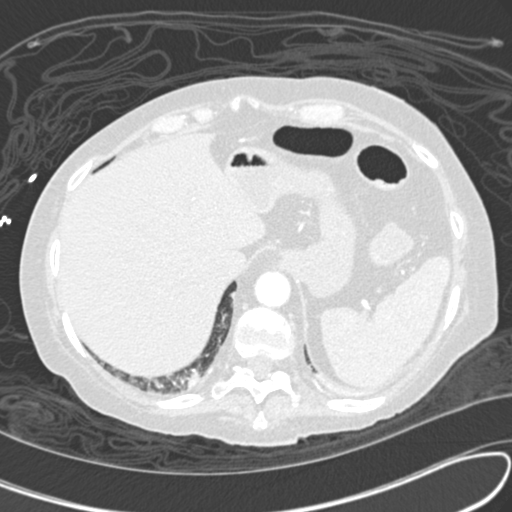
[im 55/247  mediastinal]
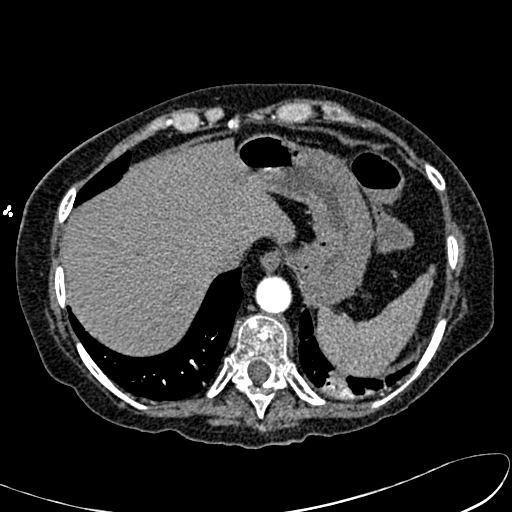
[im 69/247  lung]
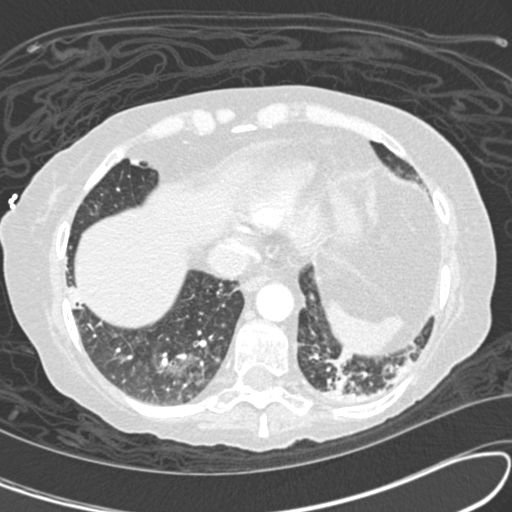
[im 83/247  mediastinal]
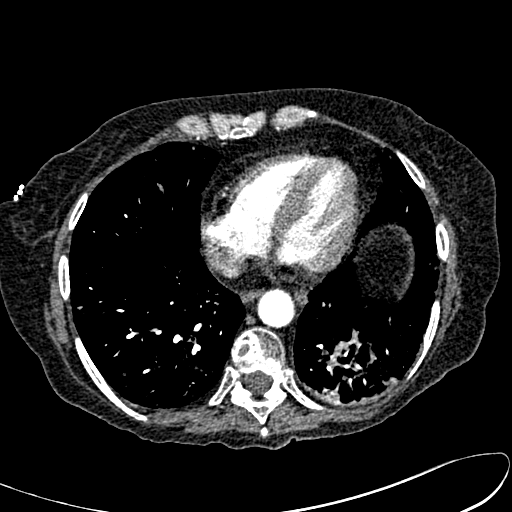
[im 96/247  lung]
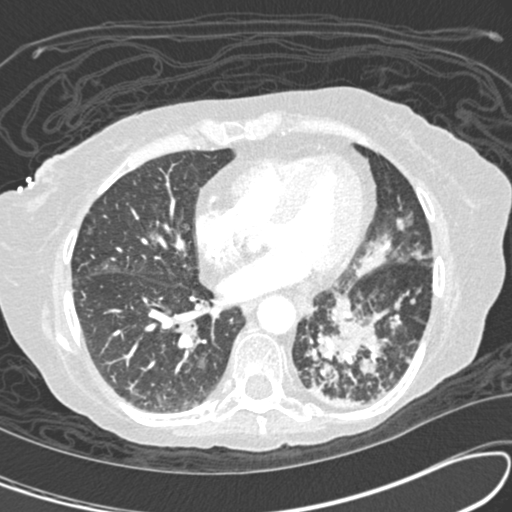
[im 110/247  mediastinal]
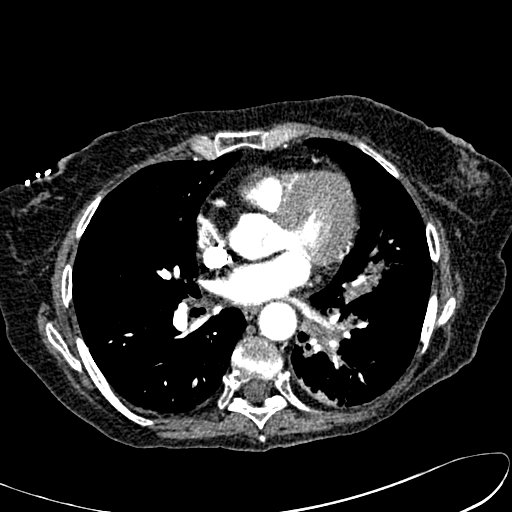
[im 137/247  lung]
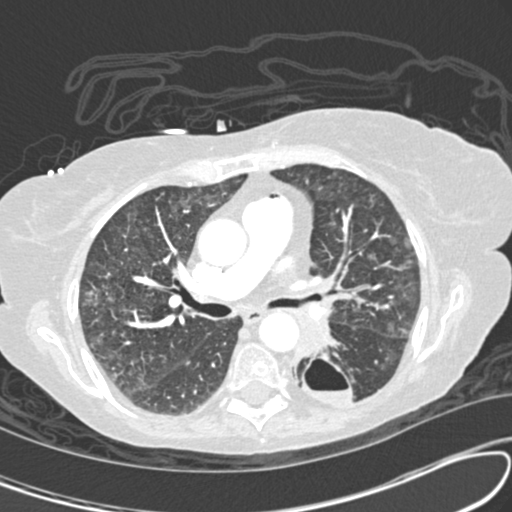
[im 151/247  mediastinal]
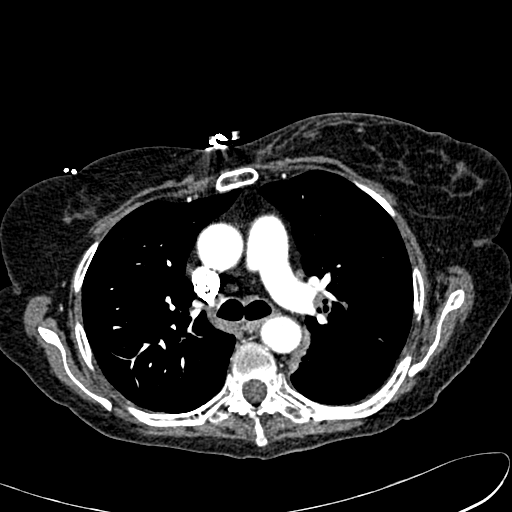
[im 165/247  lung]
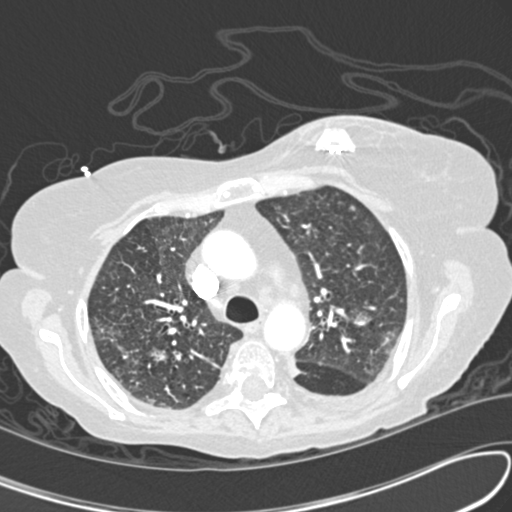
[im 178/247  mediastinal]
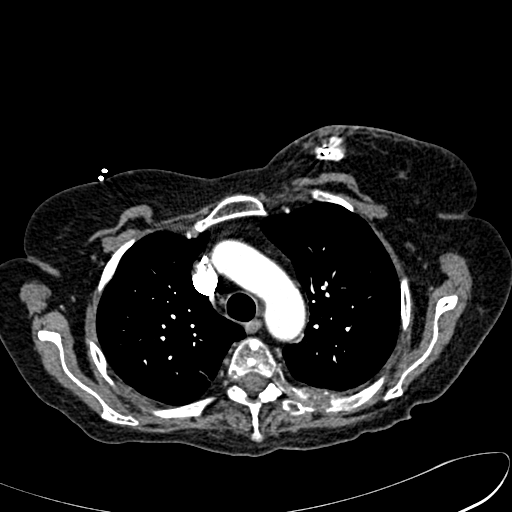
[im 192/247  lung]
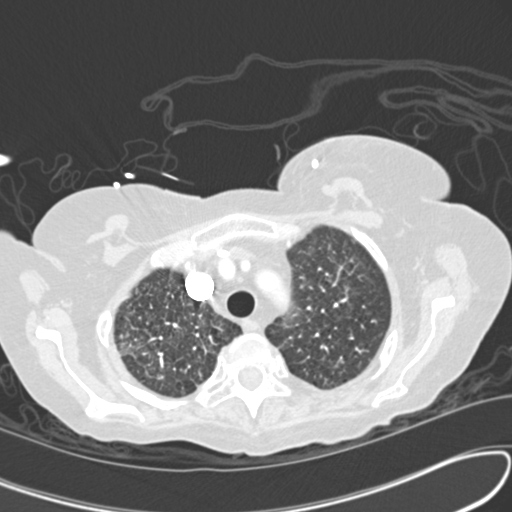
[im 206/247  mediastinal]
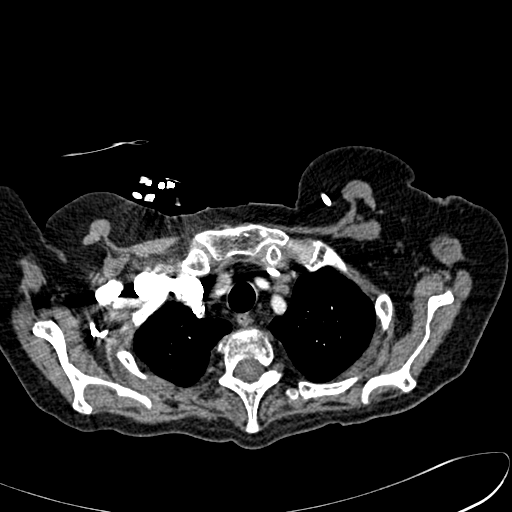
[im 219/247  lung]
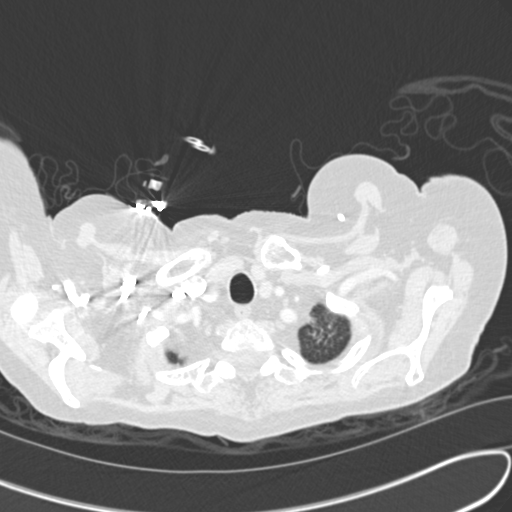
[im 233/247  mediastinal]
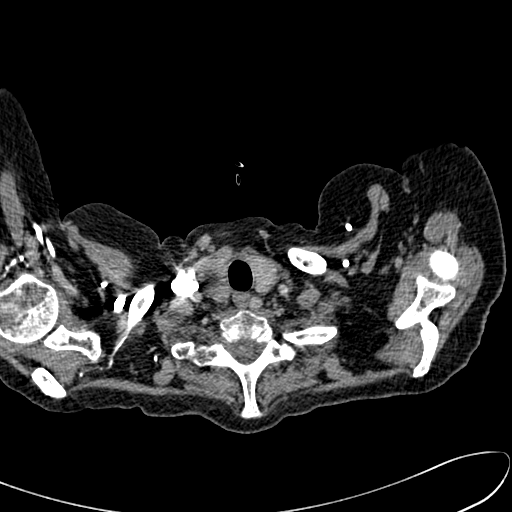

[Series 7: cor pe 2.0 mpr · coronal · 0.51mm/px · 1 of 107 slices shown]
[im 54/107  mediastinal]
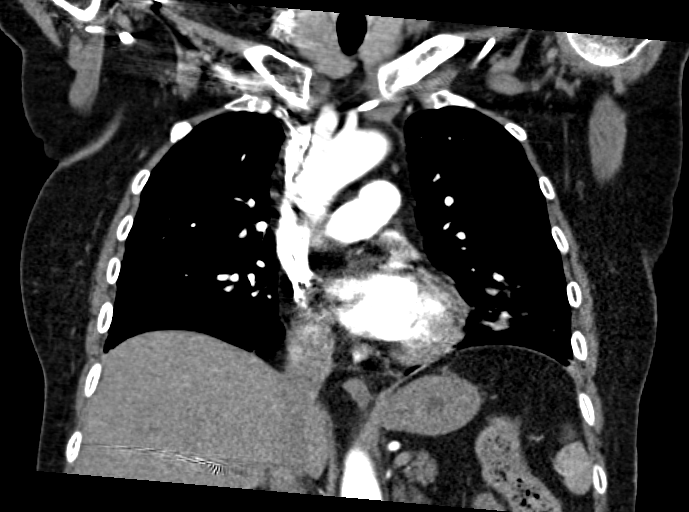

[17 of 36 positions shown; findings below may reference images not displayed]

FINDINGS: There is no evidence of pulmonary embolus.

At the previously identified perihilar left lower lobe mass, there
is now cavitation, with associated air-fluid levels. Soft tissue
density is again noted tracking about the left hilum, with worsening
narrowing of the left lower lobe bronchus and minimal associated
debris in the bronchus. There is new patchy peribronchovascular
opacity within the left lower and upper lung lobes, suspicious for
associated pneumonia. Additional scattered hazy airspace opacities
are noted throughout both lungs, concerning for acute infectious
process, possibly atypical in nature.

Previously noted innumerable pulmonary nodules are again seen,
though perhaps slightly less prominent than on the prior study.
Underlying interstitial prominence is noted, new from the prior
study.

There also appears to be debris within the bronchioles to the right
lower lobe. There is no evidence of pleural effusion or
pneumothorax.

Scattered coronary artery calcification is noted. The mediastinum is
otherwise unremarkable. No definite mediastinal lymphadenopathy is
seen. The great vessels are grossly unremarkable. No axillary
lymphadenopathy is seen. The visualized portions of the thyroid
gland are unremarkable in appearance.

Mild soft tissue inflammation is noted superficial to the patient's
left-sided chest port. The chest port is otherwise grossly
unremarkable.

The visualized portions of the liver and spleen are unremarkable.
The patient is status post cholecystectomy, with clips noted along
the gallbladder fossa. The visualized portions of the pancreas and
adrenal glands are unremarkable.

Innumerable mixed sclerotic and lytic metastatic lesions are noted
throughout the visualized osseous structures. There appears to be a
new pathologic fracture of the right posterior ninth rib, with an
underlying partially sclerotic and partially lytic lesion.

Review of the MIP images confirms the above findings.
IMPRESSION: 1. No evidence of pulmonary embolus.
2. New cavitation noted in the left lower lobe, with associated
air-fluid levels, at the previously noted location of the perihilar
left lower lobe mass. The extent of underlying malignancy is not
well assessed; underlying atypical infection cannot be excluded.
3. Soft tissue density tracking about the left hilum, with worsening
narrowing of the left lower lobe bronchus and minimal associated
endobronchial debris. New patchy peribronchovascular opacity within
the left lower and upper lung lobes is suspicious for associated
pneumonia. Additional scattered hazy airspace opacities throughout
both lungs, concerning for an acute infectious process, possibly
atypical in nature.
4. Debris suggested within the bronchus to the right lower lung
lobe.
5. Innumerable pulmonary nodules again noted, though perhaps
slightly less prominent than on the prior study. New underlying
interstitial prominence noted.
6. Interval mixed sclerotic and lytic metastatic lesions throughout
the visualized osseous structures. New pathologic fracture of the
right posterior ninth rib, with an underlying partially sclerotic
and partially lytic lesion.
7. Scattered coronary artery calcifications seen.
8. Mild soft tissue inflammation noted superficial to the left-sided
chest port. Chest port otherwise unremarkable.
